# Patient Record
Sex: Female | Born: 1985 | Hispanic: Yes | Marital: Married | Smoking: Never smoker
Health system: Southern US, Community
[De-identification: ages and names within clinical notes are randomized; demographics above are authoritative.]

## PROBLEM LIST (undated history)

## (undated) DIAGNOSIS — D649 Anemia, unspecified: Secondary | ICD-10-CM

## (undated) DIAGNOSIS — K769 Liver disease, unspecified: Secondary | ICD-10-CM

## (undated) DIAGNOSIS — E119 Type 2 diabetes mellitus without complications: Secondary | ICD-10-CM

## (undated) HISTORY — DX: Type 2 diabetes mellitus without complications: E11.9

## (undated) HISTORY — DX: Liver disease, unspecified: K76.9

## (undated) HISTORY — DX: Anemia, unspecified: D64.9

---

## 2008-03-23 ENCOUNTER — Emergency Department: Payer: Self-pay | Admitting: Emergency Medicine

## 2008-11-05 ENCOUNTER — Ambulatory Visit: Payer: Self-pay | Admitting: Advanced Practice Midwife

## 2009-03-30 ENCOUNTER — Ambulatory Visit: Payer: Self-pay | Admitting: Family Medicine

## 2009-05-06 ENCOUNTER — Observation Stay: Payer: Self-pay | Admitting: Obstetrics and Gynecology

## 2009-12-01 IMAGING — US US OB < 14 WEEKS - US OB TV
1 series · 17 of 28 positions shown · non-contrast
Comparison: none

REASON FOR EXAM: pain and vaginal bleeding  [HOSPITAL]
COMMENTS:

[Series 1: us ob < 14 weeks - us ob tv · 17 of 44 slices shown]
[im 1/44]
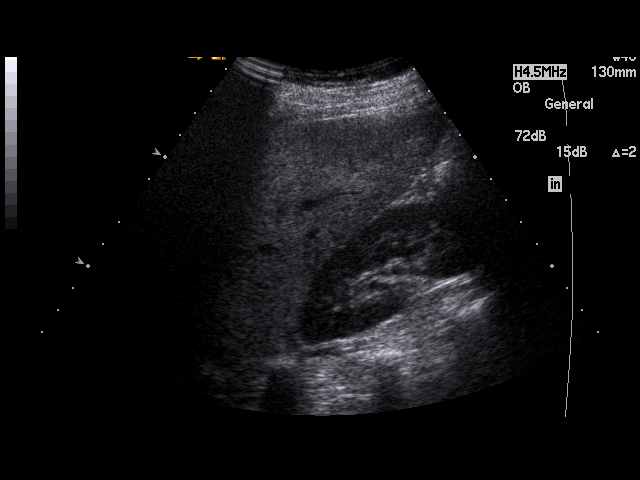
[im 4/44]
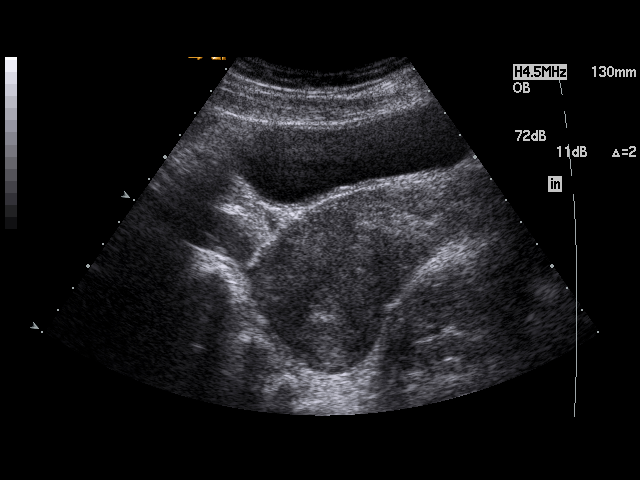
[im 7/44]
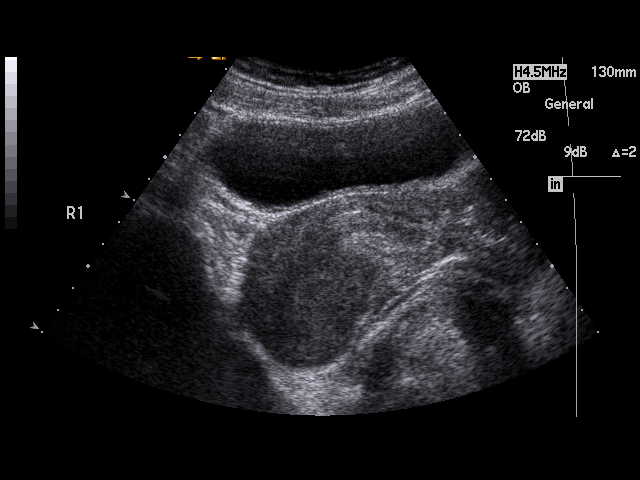
[im 8/44]
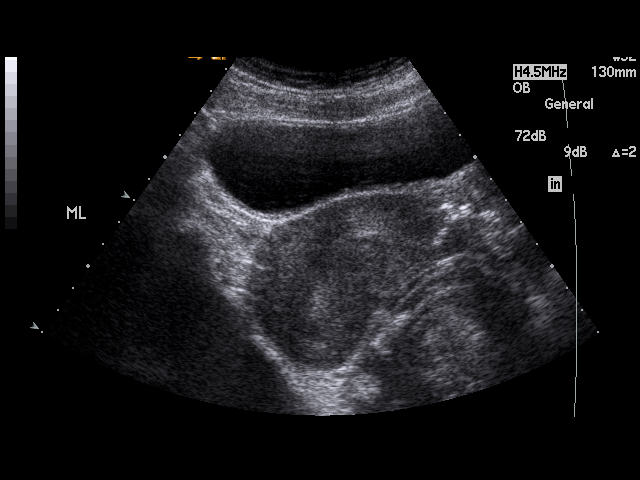
[im 12/44]
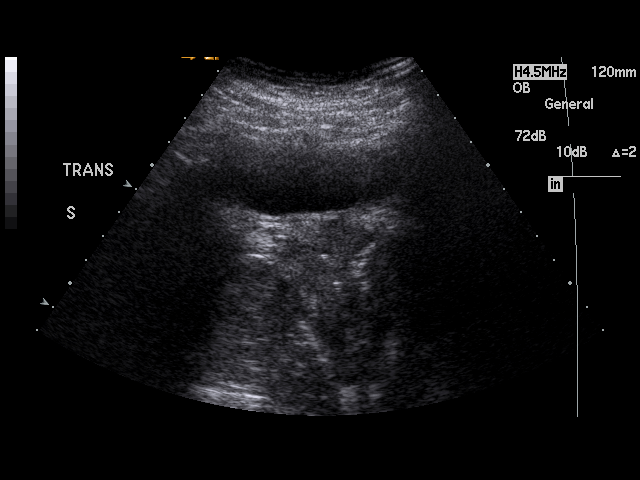
[im 15/44]
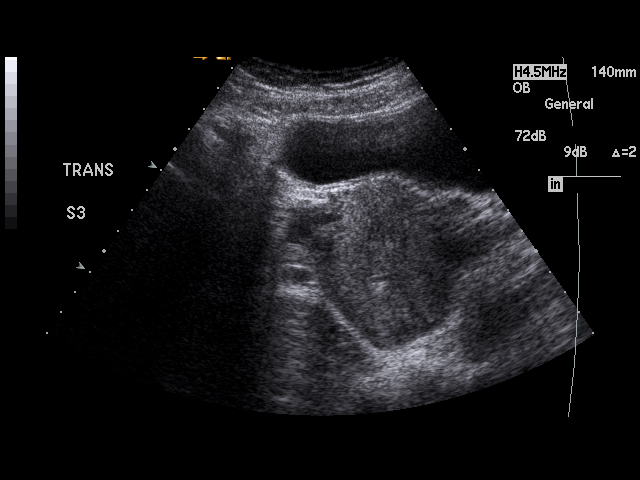
[im 16/44]
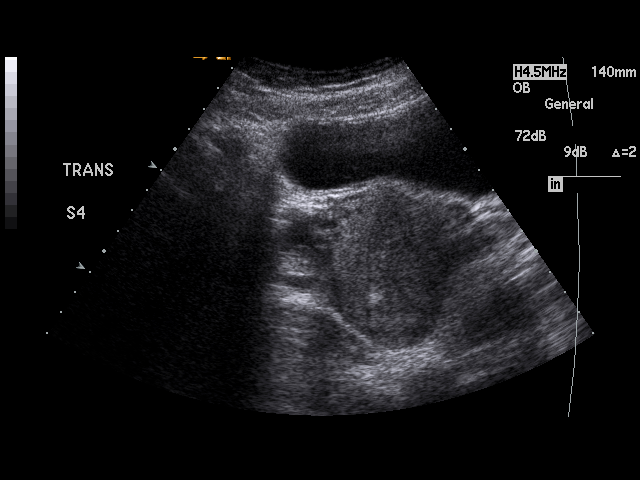
[im 20/44]
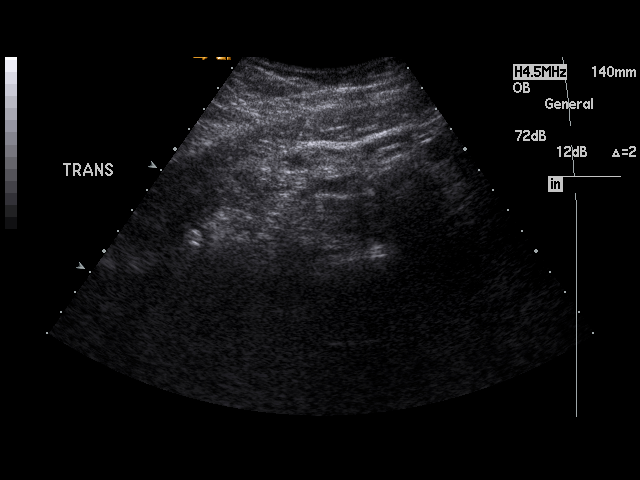
[im 23/44]
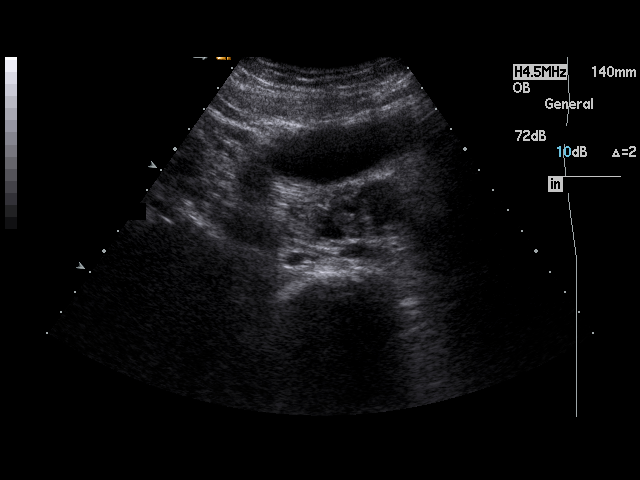
[im 24/44]
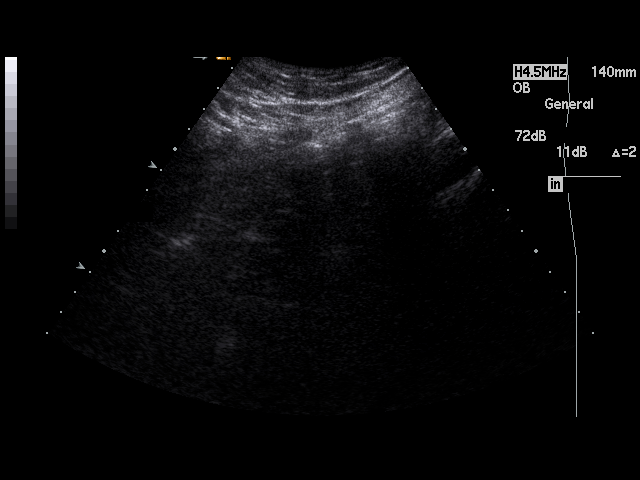
[im 28/44]
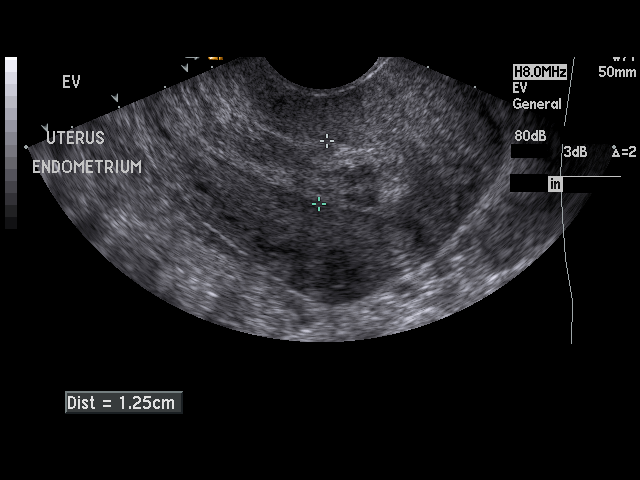
[im 29/44]
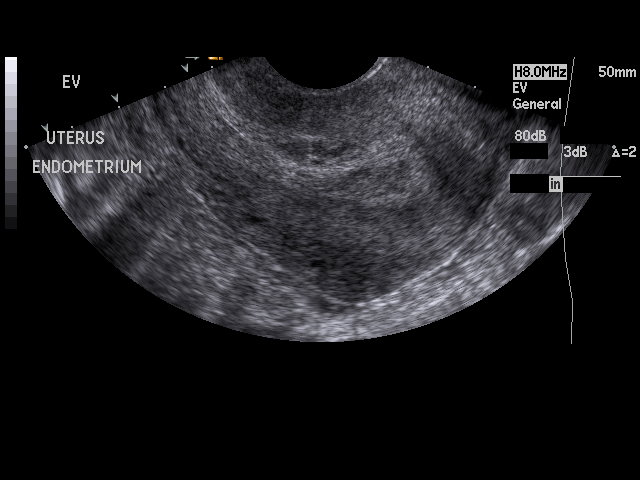
[im 32/44]
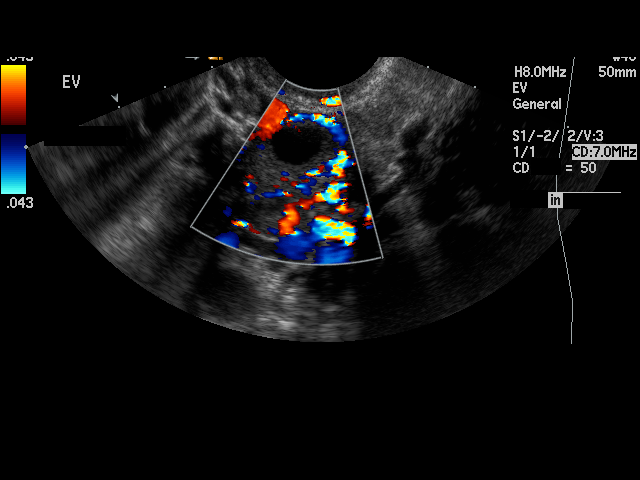
[im 36/44]
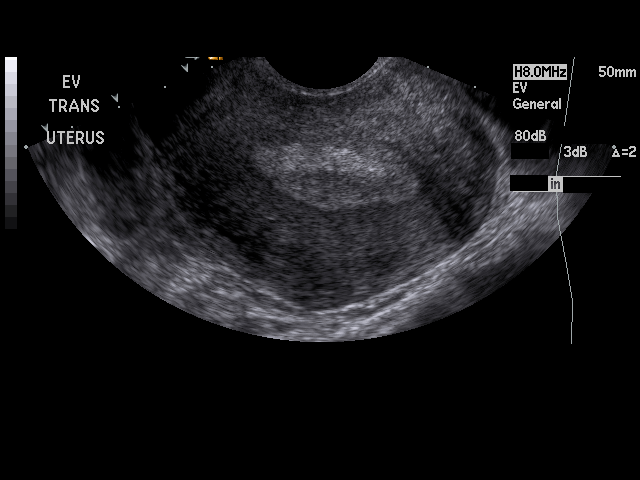
[im 37/44]
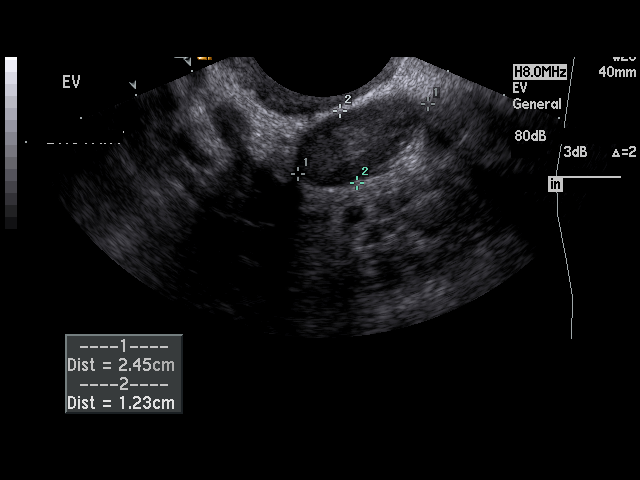
[im 40/44]
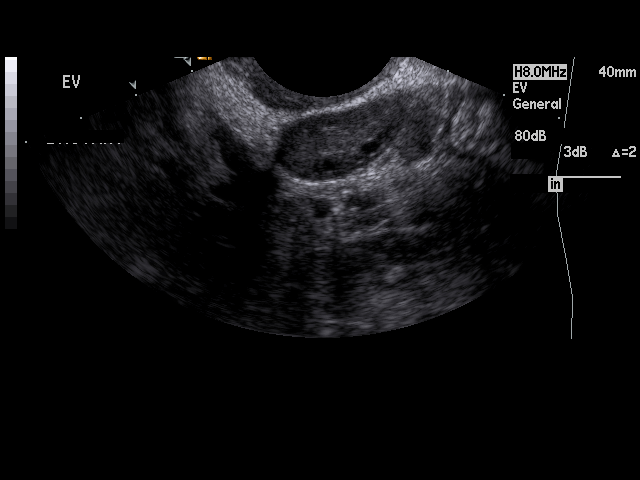
[im 44/44]
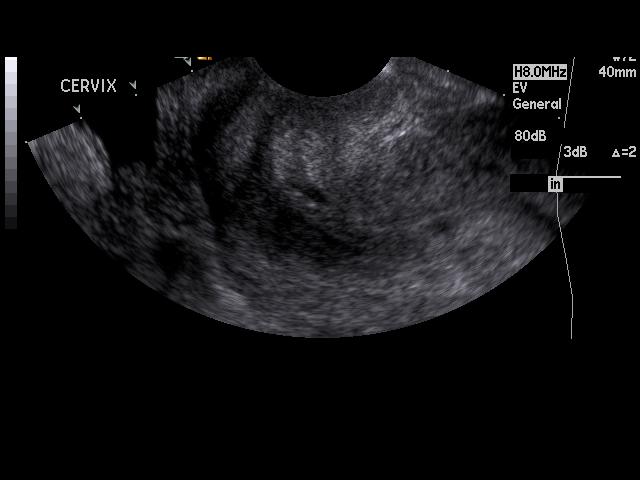

[17 of 28 positions shown; findings below may reference images not displayed]

PROCEDURE:     US  - US OB LESS THAN 14 WEEKS/W TRANS  - March 24, 2008  [DATE]

RESULT:     Emergent pelvic ultrasound utilizing transabdominal and
endovaginal imaging demonstrates no identifiable intrauterine gestational
sac. The kidneys appear to be grossly normal. The uterus appears to be
slightly retroverted. The endometrial stripe measures 12.5 mm in thickness.
There is some nonspecific fluid and echogenic debris within the endometrial
canal. The ovaries appear to be within normal limits. The RIGHT ovary
contains a roughly 10.7 mm cyst. There is no definite free fluid. The LEFT
ovary is normal.
IMPRESSION: No identifiable intrauterine or extrauterine gestation.
Followup with quantitative beta-hCG is recommended. A repeat examination
could be considered. There is thickening of the endometrium with some
irregular areas of echotexture which could represent clot or debris. A
recent abortion could give a similar appearance.

## 2010-12-07 IMAGING — US US OB US >=[ID] SNGL FETUS
1 series · 17 of 28 positions shown · non-contrast
Comparison: none

REASON FOR EXAM: Dates Size Greater Than Dates Needs Interpreter
COMMENTS:

[Series 1: us ob us >=(id) sngl fetus · 17 of 82 slices shown]
[im 1/82]
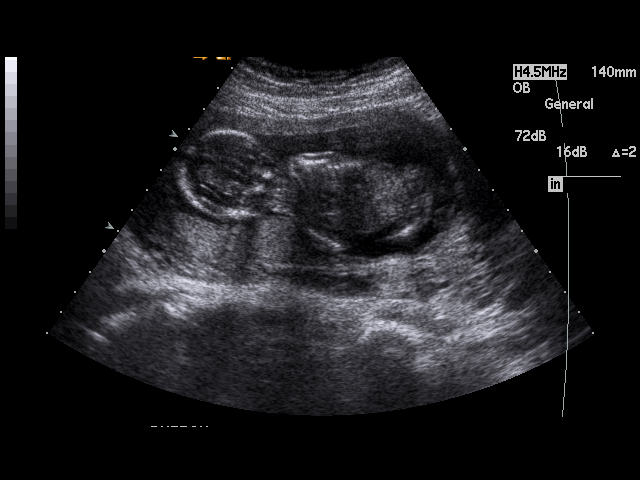
[im 7/82]
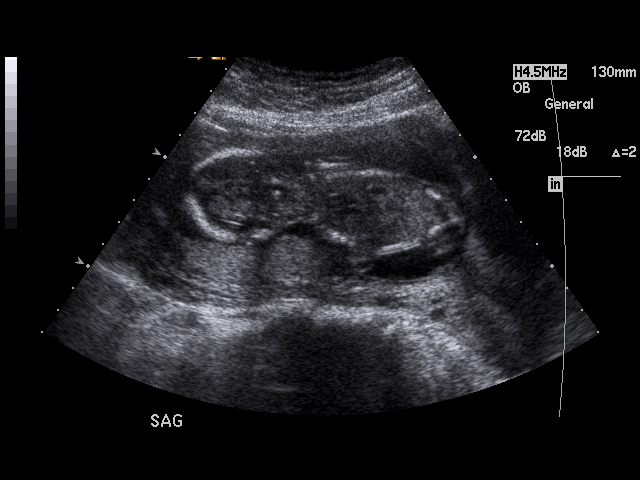
[im 13/82]
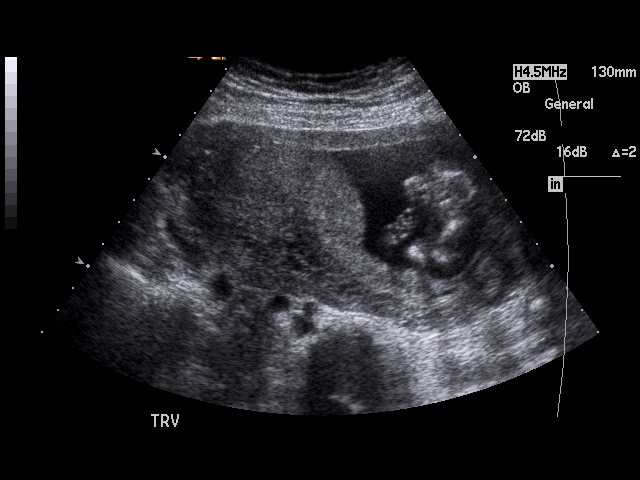
[im 16/82]
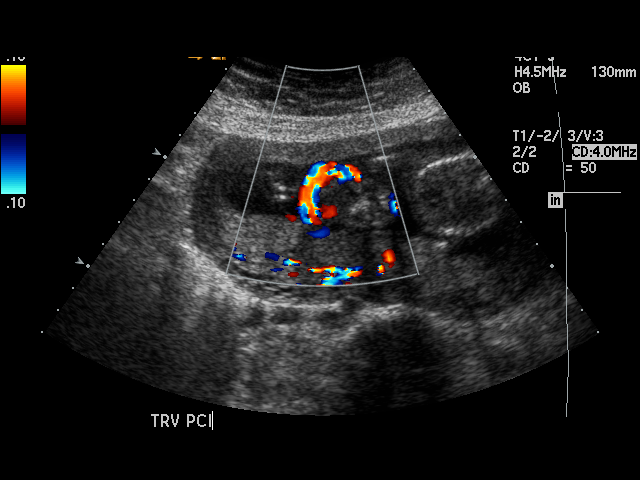
[im 22/82]
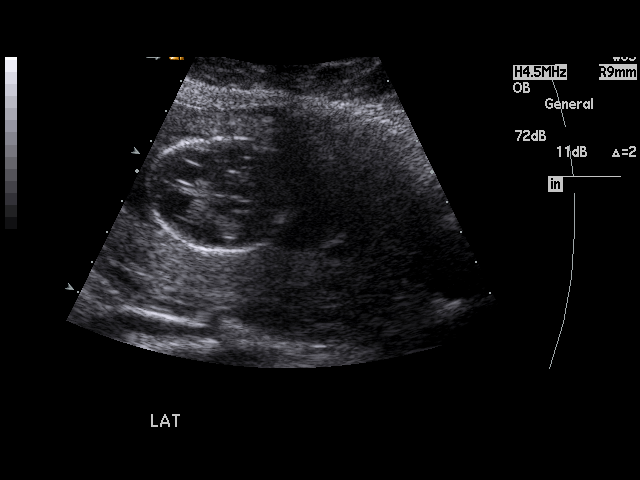
[im 28/82]
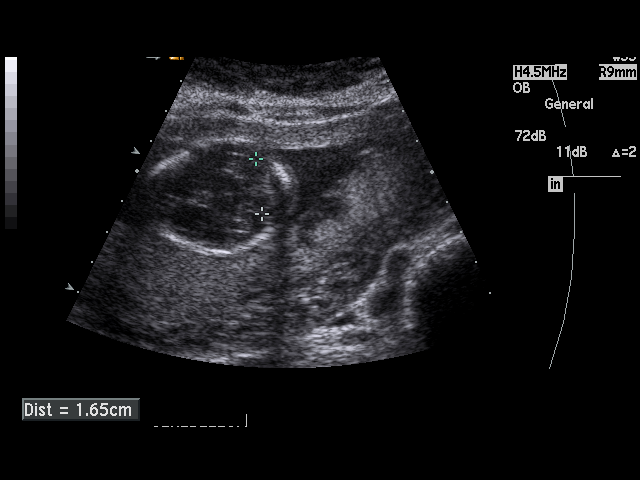
[im 31/82]
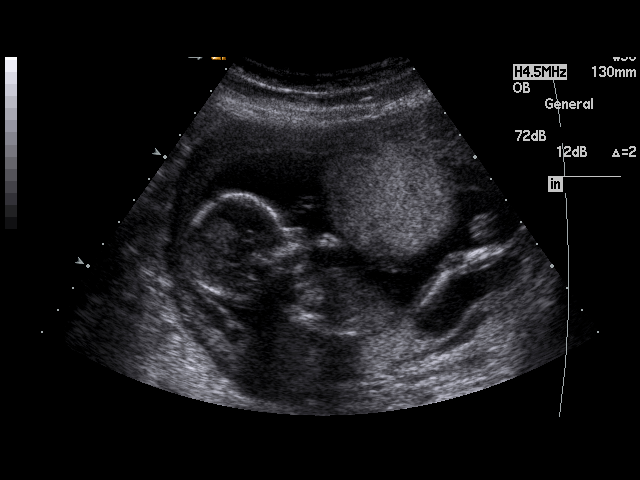
[im 37/82]
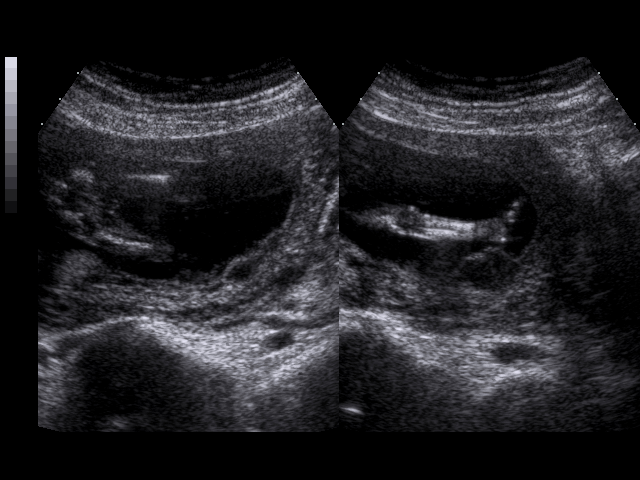
[im 43/82]
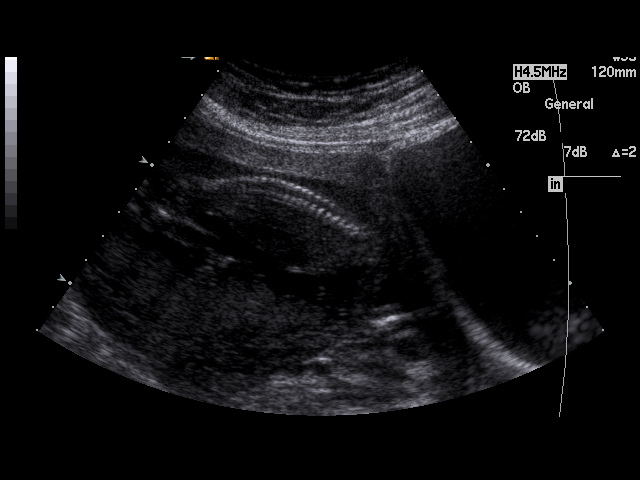
[im 46/82]
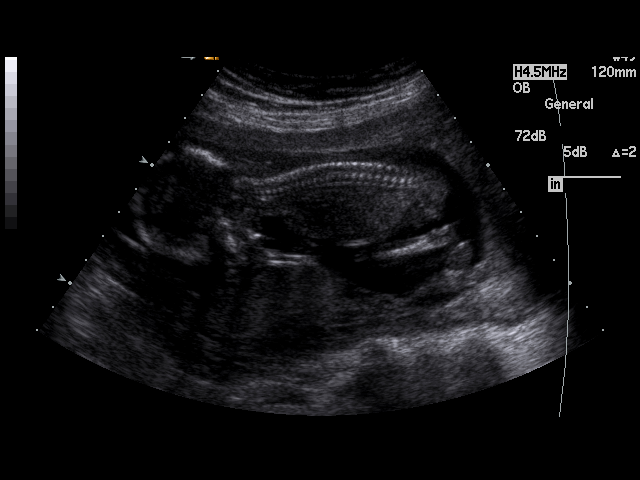
[im 52/82]
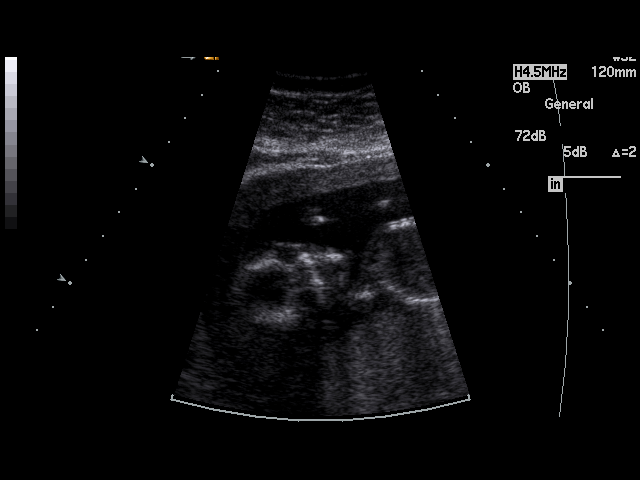
[im 55/82]
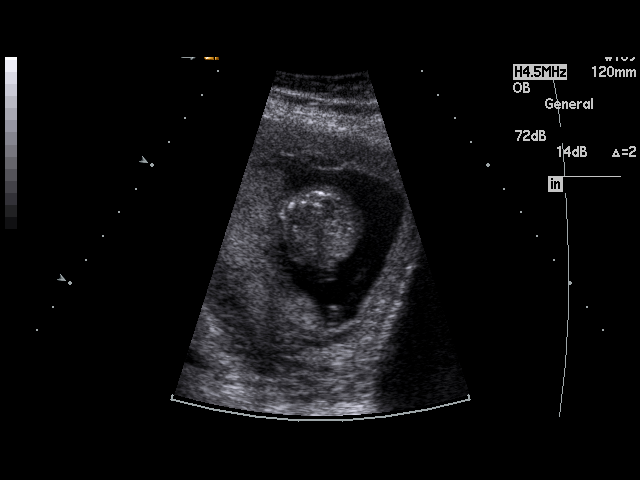
[im 61/82]
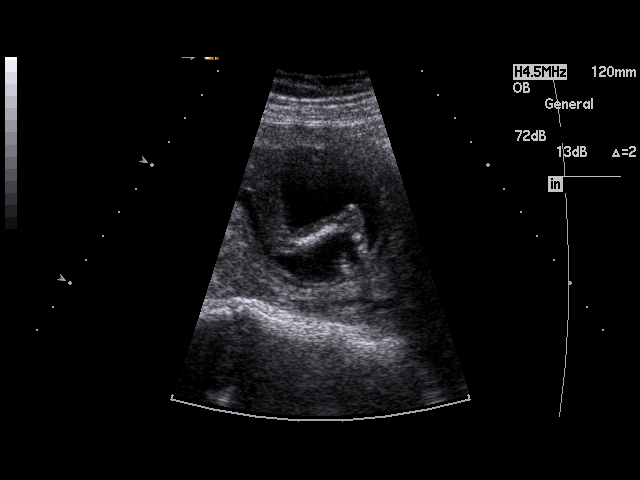
[im 67/82]
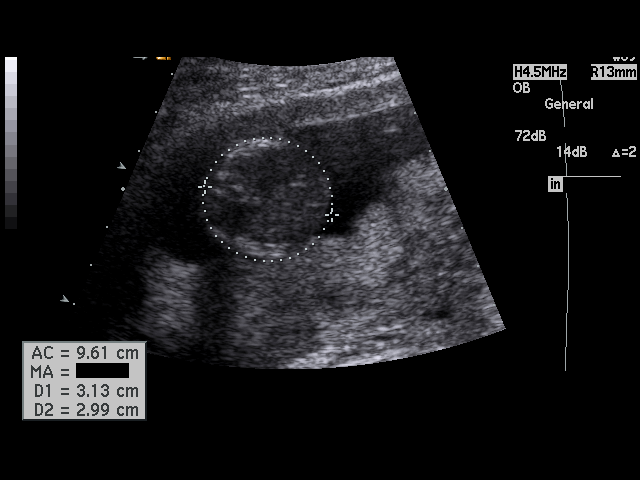
[im 70/82]
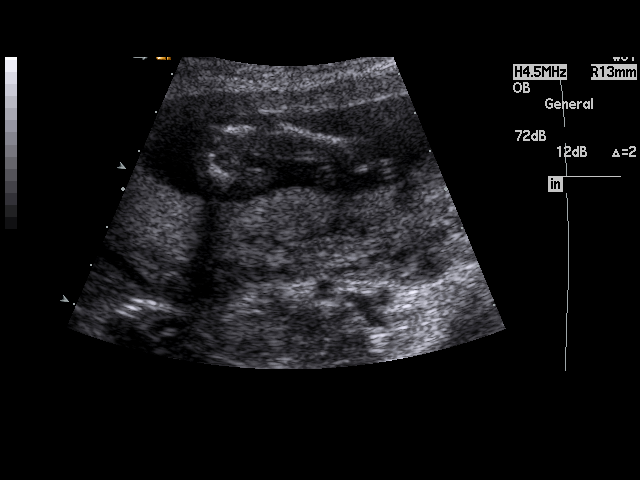
[im 76/82]
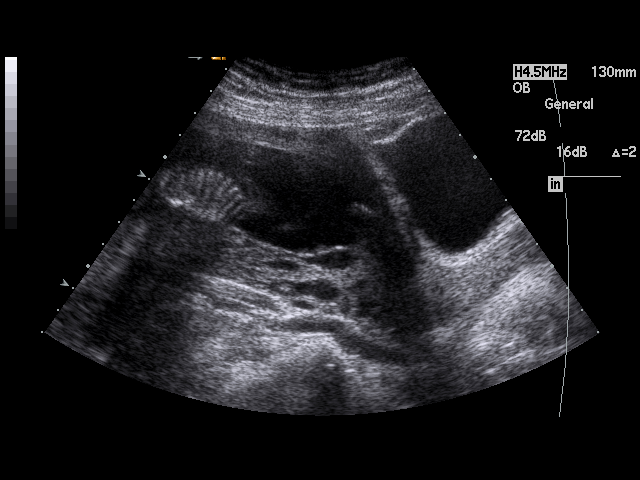
[im 82/82]
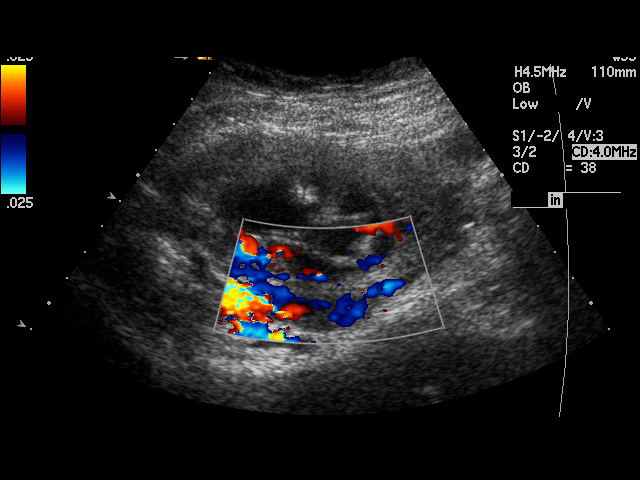

[17 of 28 positions shown; findings below may reference images not displayed]

PROCEDURE:     US  - US OB GREATER/OR EQUAL TO SS20R  - March 30, 2009  [DATE]

RESULT:     There is a gravid uterus. The presentation is currently breech.
The placenta is posterior and partially fundal. The amniotic fluid volume is
estimated to be normal. Cardiac activity with a rate of 144 beats per minute
was demonstrated. The intracranial structures, the craniocervical junction,
and the spinal structures are grossly normal for the early gestational age.
The fetal stomach, kidneys, and urinary bladder were demonstrated and
appeared normal.

Measured parameters:
BPD 3.38 cm corresponding to an EGA of 16 weeks 3 days
HC 12.47 cm corresponding to an EGA of 16 weeks 2 days
AC 10.0 cm corresponding to an EGA of 16 weeks 0 days
FL 1.92 cm corresponding to an EGA of 15 weeks 5 days
The estimated fetal weight is 161 grams + / - 22 grams.
IMPRESSION: There is an early IUP with estimated gestational age of 16
weeks one day plus or minus approximately 10 days. The estimated date of
confinement is 13 September, 2009. No fetal anomalies were identified. The
amniotic fluid volume appears normal.

## 2014-01-07 LAB — HM HIV SCREENING LAB: HM HIV Screening: NEGATIVE

## 2016-07-24 LAB — HM PAP SMEAR: HM Pap smear: NEGATIVE

## 2018-10-21 DIAGNOSIS — E119 Type 2 diabetes mellitus without complications: Secondary | ICD-10-CM | POA: Insufficient documentation

## 2019-12-18 ENCOUNTER — Emergency Department: Payer: Medicaid Other | Admitting: Certified Registered"

## 2019-12-18 ENCOUNTER — Observation Stay
Admission: EM | Admit: 2019-12-18 | Discharge: 2019-12-19 | Disposition: A | Discharge status: Home / Self Care | Payer: Medicaid Other | Attending: Obstetrics and Gynecology | Admitting: Obstetrics and Gynecology

## 2019-12-18 ENCOUNTER — Encounter
Admission: EM | Disposition: A | Discharge status: Home / Self Care | Payer: Self-pay | Source: Home / Self Care | Attending: Emergency Medicine

## 2019-12-18 ENCOUNTER — Other Ambulatory Visit: Payer: Self-pay

## 2019-12-18 DIAGNOSIS — O021 Missed abortion: Secondary | ICD-10-CM | POA: Diagnosis present

## 2019-12-18 DIAGNOSIS — Z20822 Contact with and (suspected) exposure to covid-19: Secondary | ICD-10-CM | POA: Insufficient documentation

## 2019-12-18 DIAGNOSIS — Z3A13 13 weeks gestation of pregnancy: Secondary | ICD-10-CM | POA: Insufficient documentation

## 2019-12-18 DIAGNOSIS — Z9889 Other specified postprocedural states: Secondary | ICD-10-CM

## 2019-12-18 DIAGNOSIS — R739 Hyperglycemia, unspecified: Secondary | ICD-10-CM | POA: Diagnosis not present

## 2019-12-18 DIAGNOSIS — O039 Complete or unspecified spontaneous abortion without complication: Secondary | ICD-10-CM

## 2019-12-18 DIAGNOSIS — O031 Delayed or excessive hemorrhage following incomplete spontaneous abortion: Principal | ICD-10-CM | POA: Insufficient documentation

## 2019-12-18 HISTORY — PX: DILATION AND EVACUATION: SHX1459

## 2019-12-18 LAB — BASIC METABOLIC PANEL
Anion gap: 12 (ref 5–15)
BUN: 13 mg/dL (ref 6–20)
CO2: 20 mmol/L — ABNORMAL LOW (ref 22–32)
Calcium: 9.1 mg/dL (ref 8.9–10.3)
Chloride: 103 mmol/L (ref 98–111)
Creatinine, Ser: 0.88 mg/dL (ref 0.44–1.00)
GFR calc Af Amer: >60 mL/min (ref >60)
GFR calc non Af Amer: >60 mL/min (ref >60)
Glucose, Bld: 186 mg/dL — ABNORMAL HIGH (ref 70–99)
Potassium: 3.2 mmol/L — ABNORMAL LOW (ref 3.5–5.1)
Sodium: 135 mmol/L (ref 135–145)

## 2019-12-18 LAB — CBC
HCT: 32.9 % — ABNORMAL LOW (ref 36.0–46.0)
Hemoglobin: 10.7 g/dL — ABNORMAL LOW (ref 12.0–15.0)
MCH: 25.6 pg — ABNORMAL LOW (ref 26.0–34.0)
MCHC: 32.5 g/dL (ref 30.0–36.0)
MCV: 78.7 fL — ABNORMAL LOW (ref 80.0–100.0)
Platelets: 362 10*3/uL (ref 150–400)
RBC: 4.18 MIL/uL (ref 3.87–5.11)
RDW: 13.2 % (ref 11.5–15.5)
WBC: 10.3 10*3/uL (ref 4.0–10.5)
nRBC: 0 % (ref 0.0–0.2)

## 2019-12-18 LAB — GLUCOSE, CAPILLARY: Glucose-Capillary: 202 mg/dL — ABNORMAL HIGH (ref 70–99)

## 2019-12-18 LAB — TYPE AND SCREEN
ABO/RH(D): O POS
Antibody Screen: NEGATIVE

## 2019-12-18 LAB — SARS CORONAVIRUS 2 BY RT PCR (HOSPITAL ORDER, PERFORMED IN ~~LOC~~ HOSPITAL LAB): SARS Coronavirus 2: NEGATIVE

## 2019-12-18 LAB — HCG, QUANTITATIVE, PREGNANCY: hCG, Beta Chain, Quant, S: 270 m[IU]/mL — ABNORMAL HIGH (ref <5)

## 2019-12-18 SURGERY — DILATION AND EVACUATION, UTERUS
Anesthesia: General

## 2019-12-18 MED ORDER — ONDANSETRON HCL 4 MG PO TABS: 4.0000 mg | ORAL_TABLET | Freq: Four times a day (QID) | ORAL | Status: DC | PRN

## 2019-12-18 MED ORDER — FENTANYL CITRATE (PF) 100 MCG/2ML IJ SOLN
INTRAMUSCULAR | Status: AC
  Filled 2019-12-18: qty 2

## 2019-12-18 MED ORDER — ACETAMINOPHEN 500 MG PO TABS
1000.0000 mg | ORAL_TABLET | Freq: Four times a day (QID) | ORAL | Status: DC
  Administered 2019-12-19: 1000 mg via ORAL
  Filled 2019-12-18 (×2): qty 2

## 2019-12-18 MED ORDER — SODIUM CHLORIDE 0.9 % IV SOLN
100.0000 mg | Freq: Once | INTRAVENOUS | Status: AC
  Administered 2019-12-18: 100 mg via INTRAVENOUS
  Filled 2019-12-18: qty 100

## 2019-12-18 MED ORDER — IBUPROFEN 600 MG PO TABS: 600.0000 mg | ORAL_TABLET | Freq: Four times a day (QID) | ORAL | Status: DC

## 2019-12-18 MED ORDER — TRAMADOL HCL 50 MG PO TABS: 50.0000 mg | ORAL_TABLET | Freq: Four times a day (QID) | ORAL | Status: DC | PRN

## 2019-12-18 MED ORDER — FENTANYL CITRATE (PF) 100 MCG/2ML IJ SOLN
INTRAMUSCULAR | Status: DC | PRN
  Administered 2019-12-18: 50 ug via INTRAVENOUS

## 2019-12-18 MED ORDER — SUCCINYLCHOLINE CHLORIDE 20 MG/ML IJ SOLN
INTRAMUSCULAR | Status: DC | PRN
  Administered 2019-12-18: 100 mg via INTRAVENOUS

## 2019-12-18 MED ORDER — SODIUM CHLORIDE FLUSH 0.9 % IV SOLN
INTRAVENOUS | Status: AC
  Filled 2019-12-18: qty 10

## 2019-12-18 MED ORDER — MIDAZOLAM HCL 2 MG/2ML IJ SOLN
INTRAMUSCULAR | Status: AC
  Filled 2019-12-18: qty 2

## 2019-12-18 MED ORDER — ONDANSETRON HCL 4 MG/2ML IJ SOLN
INTRAMUSCULAR | Status: DC | PRN
  Administered 2019-12-18: 4 mg via INTRAVENOUS

## 2019-12-18 MED ORDER — AMMONIA AROMATIC IN INHA
RESPIRATORY_TRACT | Status: AC
  Filled 2019-12-18: qty 20

## 2019-12-18 MED ORDER — PROPOFOL 10 MG/ML IV BOLUS
INTRAVENOUS | Status: DC | PRN
  Administered 2019-12-18: 130 mg via INTRAVENOUS

## 2019-12-18 MED ORDER — DOXYCYCLINE HYCLATE 100 MG IV SOLR: 200.0000 mg | INTRAVENOUS | Status: DC

## 2019-12-18 MED ORDER — MIDAZOLAM HCL 2 MG/2ML IJ SOLN
INTRAMUSCULAR | Status: DC | PRN
  Administered 2019-12-18: 2 mg via INTRAVENOUS

## 2019-12-18 MED ORDER — PROMETHAZINE HCL 25 MG/ML IJ SOLN: 6.2500 mg | INTRAMUSCULAR | Status: DC | PRN

## 2019-12-18 MED ORDER — MORPHINE SULFATE (PF) 2 MG/ML IV SOLN: 1.0000 mg | INTRAVENOUS | Status: DC | PRN

## 2019-12-18 MED ORDER — PROPOFOL 10 MG/ML IV BOLUS
INTRAVENOUS | Status: AC
  Filled 2019-12-18: qty 20

## 2019-12-18 MED ORDER — DOCUSATE SODIUM 100 MG PO CAPS
100.0000 mg | ORAL_CAPSULE | Freq: Two times a day (BID) | ORAL | Status: DC
  Administered 2019-12-19: 100 mg via ORAL
  Filled 2019-12-18: qty 1

## 2019-12-18 MED ORDER — SIMETHICONE 80 MG PO CHEW
80.0000 mg | CHEWABLE_TABLET | Freq: Four times a day (QID) | ORAL | Status: DC
  Administered 2019-12-19 (×2): 80 mg via ORAL
  Filled 2019-12-18 (×2): qty 1

## 2019-12-18 MED ORDER — ONDANSETRON HCL 4 MG/2ML IJ SOLN: 4.0000 mg | Freq: Four times a day (QID) | INTRAMUSCULAR | Status: DC | PRN

## 2019-12-18 MED ORDER — SILVER NITRATE-POT NITRATE 75-25 % EX MISC
CUTANEOUS | Status: AC
  Filled 2019-12-18: qty 40

## 2019-12-18 MED ORDER — DEXAMETHASONE SODIUM PHOSPHATE 10 MG/ML IJ SOLN
INTRAMUSCULAR | Status: DC | PRN
  Administered 2019-12-18: 10 mg via INTRAVENOUS

## 2019-12-18 MED ORDER — SODIUM CHLORIDE 0.9 % IV SOLN
INTRAVENOUS | Status: AC
  Administered 2019-12-18: 2000 mL via INTRAVENOUS

## 2019-12-18 MED ORDER — LIDOCAINE HCL (CARDIAC) PF 100 MG/5ML IV SOSY
PREFILLED_SYRINGE | INTRAVENOUS | Status: DC | PRN
  Administered 2019-12-18: 80 mg via INTRAVENOUS

## 2019-12-18 MED ORDER — PROMETHAZINE HCL 25 MG/ML IJ SOLN
INTRAMUSCULAR | Status: AC
  Filled 2019-12-18: qty 1

## 2019-12-18 MED ORDER — LACTATED RINGERS IV SOLN: INTRAVENOUS | Status: DC

## 2019-12-18 MED ORDER — SODIUM CHLORIDE 0.9 % IV SOLN
INTRAVENOUS | Status: DC | PRN
Start: 2019-12-18 — End: 2019-12-18

## 2019-12-18 MED ORDER — FENTANYL CITRATE (PF) 100 MCG/2ML IJ SOLN: 25.0000 ug | INTRAMUSCULAR | Status: DC | PRN

## 2019-12-18 MED ORDER — TRAMADOL HCL 50 MG PO TABS: 50.0000 mg | ORAL_TABLET | Freq: Four times a day (QID) | ORAL | 0 refills | Status: DC | PRN

## 2019-12-18 SURGICAL SUPPLY — 23 items
BAG COUNTER SPONGE EZ (MISCELLANEOUS) ×2 IMPLANT
CATH ROBINSON RED A/P 16FR (CATHETERS) ×3 IMPLANT
COUNTER SPONGE BAG EZ (MISCELLANEOUS) ×1
FILTER UTR ASPR SPEC (MISCELLANEOUS) ×1 IMPLANT
FLTR UTR ASPR SPEC (MISCELLANEOUS) ×3
GAUZE 4X4 16PLY RFD (DISPOSABLE) ×3 IMPLANT
GLOVE BIOGEL PI IND STRL 6.5 (GLOVE) ×2 IMPLANT
GLOVE BIOGEL PI INDICATOR 6.5 (GLOVE) ×4
GLOVE SURG SYN 6.5 ES PF (GLOVE) ×6 IMPLANT
GOWN STRL REUS W/ TWL LRG LVL3 (GOWN DISPOSABLE) ×2 IMPLANT
GOWN STRL REUS W/TWL LRG LVL3 (GOWN DISPOSABLE) ×4
KIT BERKELEY 1ST TRIMESTER 3/8 (MISCELLANEOUS) ×3 IMPLANT
KIT TURNOVER CYSTO (KITS) ×3 IMPLANT
NS IRRIG 500ML POUR BTL (IV SOLUTION) ×3 IMPLANT
PACK DNC HYST (MISCELLANEOUS) ×3 IMPLANT
PAD OB MATERNITY 4.3X12.25 (PERSONAL CARE ITEMS) ×3 IMPLANT
PAD PREP 24X41 OB/GYN DISP (PERSONAL CARE ITEMS) ×3 IMPLANT
SET BERKELEY SUCTION TUBING (SUCTIONS) ×3 IMPLANT
TOWEL OR 17X26 4PK STRL BLUE (TOWEL DISPOSABLE) ×3 IMPLANT
VACURETTE 10 RIGID CVD (CANNULA) IMPLANT
VACURETTE 12 RIGID CVD (CANNULA) IMPLANT
VACURETTE 8 RIGID CVD (CANNULA) IMPLANT
VACURETTE 8MM F TIP (MISCELLANEOUS) ×6 IMPLANT

## 2019-12-18 NOTE — ED Provider Notes (Signed)
ER Provider Note       Time seen: 6:41 PM    I have reviewed the vital signs and the nursing notes.  HISTORY   Chief Complaint Vaginal Bleeding    HPI Judy Clark is a 34 y.o. female with no known past medical history but who is reportedly in her fifth pregnancy and having vaginal bleeding who presents today for vaginal bleeding.  Reportedly she was at [facility] yesterday and she was given misoprostol all.  She is started having heavy vaginal bleeding and passing of tissue and clots.  She felt like she was going to pass out.  Discomfort was 10 out of 10.  History reviewed. No pertinent past medical history.  History reviewed. No pertinent surgical history.  Allergies Patient has no allergy information on record.  Review of Systems Constitutional: Negative for fever. Cardiovascular: Negative for chest pain. Respiratory: Negative for shortness of breath. Gastrointestinal: Positive for abdominal pain Genitourinary: Positive for heavy vaginal bleeding and cramping Musculoskeletal: Negative for back pain. Skin: Negative for rash. Neurological: Negative for headaches, focal weakness or numbness.  All systems negative/normal/unremarkable except as stated in the HPI  ____________________________________________   PHYSICAL EXAM:  VITAL SIGNS: Vitals:   12/18/19 1806  BP: 93/81  Pulse: (!) 150  Resp: 18  Temp: 98.5 F (36.9 C)    Constitutional: Alert and oriented.  Mild to moderate distress Eyes: Conjunctivae are normal. Normal extraocular movements. Cardiovascular: Normal rate, regular rhythm. No murmurs, rubs, or gallops. Respiratory: Normal respiratory effort without tachypnea nor retractions. Breath sounds are clear and equal bilaterally. No wheezes/rales/rhonchi. Gastrointestinal: Lower abdominal tenderness Genitourinary: Heavy vaginal bleeding and clots are present, there is products of conception coming from the cervical os, fetus is  visualized Musculoskeletal: Nontender with normal range of motion in extremities. No lower extremity tenderness nor edema. Neurologic:  Normal speech and language. No gross focal neurologic deficits are appreciated.  Skin:  Skin is warm, dry and intact.  Pallor is noted Psychiatric:anxious mood and affect ____________________________________________   LABS (pertinent positives/negatives)  Labs Reviewed  CBC - Abnormal; Notable for the following components:      Result Value   Hemoglobin 10.7 (*)    HCT 32.9 (*)    MCV 78.7 (*)    MCH 25.6 (*)    All other components within normal limits  BASIC METABOLIC PANEL - Abnormal; Notable for the following components:   Potassium 3.2 (*)    CO2 20 (*)    Glucose, Bld 186 (*)    All other components within normal limits  GLUCOSE, CAPILLARY - Abnormal; Notable for the following components:   Glucose-Capillary 202 (*)    All other components within normal limits  HCG, QUANTITATIVE, PREGNANCY  POC URINE PREG, ED  TYPE AND SCREEN   Hemoglobin yesterday was 13.2, currently 10.7  DIFFERENTIAL DIAGNOSIS  Miscarriage, threatened miscarriage, hemorrhage, acute blood loss anemia  ASSESSMENT AND PLAN  Miscarriage, syncope   Plan: The patient had presented for heavy vaginal bleeding after misoprostol. Patient's labs did reveal drop in her hemoglobin from 13-10.7.  I discussed with OB/GYN who will take her to the OR for D&C.  She remained stable at this time.  Products of conception were placed in a specimen container.  Daryel November MD    Note: This note was generated in part or whole with voice recognition software. Voice recognition is usually quite accurate but there are transcription errors that can and very often do occur. I apologize for any typographical  errors that were not detected and corrected.     Emily Filbert, MD 12/18/19 [phone]

## 2019-12-18 NOTE — ED Notes (Signed)
Dr. Jerene Pitch at bedside speaking with pt, pt verbalizes understanding of D&C procedure

## 2019-12-18 NOTE — Anesthesia Procedure Notes (Signed)
Procedure Name: Intubation Date/Time: 12/18/2019 8:23 PM Performed by: Martha Clan, MD Pre-anesthesia Checklist: Patient identified, Emergency Drugs available, Suction available and Patient being monitored Patient Re-evaluated:Patient Re-evaluated prior to induction Oxygen Delivery Method: Circle system utilized Preoxygenation: Pre-oxygenation with 100% oxygen Induction Type: IV induction, Rapid sequence and Cricoid Pressure applied Laryngoscope Size: Mac and 3 Grade View: Grade I Tube type: Oral Tube size: 7.0 mm Number of attempts: 1 Airway Equipment and Method: Stylet Placement Confirmation: ETT inserted through vocal cords under direct vision,  positive ETCO2 and breath sounds checked- equal and bilateral Secured at: 19 cm Tube secured with: Tape Dental Injury: Teeth and Oropharynx as per pre-operative assessment

## 2019-12-18 NOTE — Progress Notes (Signed)
Patient reports she continues to be dizzy and fatigue. Will admit to observation overnight. Vital signs stable. H&H pending.   Adelene Idler MD, [facility], [facility] 12/18/2019 10:37 PM

## 2019-12-18 NOTE — Op Note (Signed)
  Operative Note  12/18/2019 9:02 PM  PRE-OP DIAGNOSIS: incomplete abortion   POST-OP DIAGNOSIS: same  SURGEON: Adelene Idler MD  ANESTHESIA: General   PROCEDURE: Procedure(s): DILATATION AND EVACUATION   ESTIMATED BLOOD LOSS: 200 mL   SPECIMENS: POC   COMPLICATIONS: none  DISPOSITION: PACU - hemodynamically stable.  CONDITION: stable  FINDINGS: Exam under anesthesia revealed a 13 wk size uterus without palpable adnexal masses.   INDICATION FOR PROCEDURE: Acute hemorrhage, retained products of conception  PROCEDURE IN DETAIL: After informed consent was obtained, the patient was taken to the operating room where anesthesia was obtained without difficulty. The patient was positioned in the dorsal lithotomy position with [employer]. Time out was performed and an exam under anesthesia was performed. The vagina, perineum, and lower abdomen were prepped and draped in a normal sterile fashion. The bladder was emptied with an I&O catheter. A speculum was placed into the vagina and the cervix was grasped with a single toothed tenaculum.  The cervix was visually dilated. A large amount of products of conception were teased from the cervical os with the ring forceps. The suction was then tested and found to be adequate, and a 8 mm flexible suction cannula was advanced into the uterine cavity. The suction was activated and the contents of the uterus were aspirated until no further tissue was obtained. The uterus was then curetted to gritty texture throughout.  At the end of the procedure bleeding was noted to beMinimal.  All instruments were then removed from the vagina.The patient tolerated the procedure well. All sponge, instrument, and needle counts were correct. The patient was taken to the recovery room in good condition.   Adelene Idler MD [facility], [facility] 12/18/2019 9:02 PM

## 2019-12-18 NOTE — Transfer of Care (Signed)
Immediate Anesthesia Transfer of Care Note  Patient: Binta Mojica Alvarado  Procedure(s) Performed: DILATATION AND EVACUATION (N/A )  Patient Location: PACU  Anesthesia Type:General  Level of Consciousness: awake, alert  and oriented  Airway & Oxygen Therapy: Patient Spontanous Breathing and Patient connected to face mask oxygen  Post-op Assessment: Report given to RN and Post -op Vital signs reviewed and stable  Post vital signs: Reviewed and stable  Last Vitals:  Vitals Value Taken Time  BP 107/69 12/18/19 2101  Temp    Pulse 94 12/18/19 2106  Resp 18 12/18/19 2106  SpO2 100 % 12/18/19 2106  Vitals shown include unvalidated device data.  Last Pain:  Vitals:   12/18/19 1927  TempSrc: Oral  PainSc: 6          Complications: No complications documented.

## 2019-12-18 NOTE — ED Notes (Signed)
Pt had syncopal episode in triage room. Pt appears pale. Pt now awake and wanting water to drink.

## 2019-12-18 NOTE — H&P (Signed)
Judy Clark is an 34 y.o. female.   Chief Complaint: Acute blood losst HPI: Patient presents to ER with significant hemorrhage.  Per the ER physician the patient passed a large amount of blood in triage, became syncopal, and had a trail of blood from triage desk to the room.  She was diagnosed with a missed abortion and took misoprostol at home earlier today.  Patient and her husband reports that about 4 hours ago she started passing large clots and having heavy bleeding.  She is feeling dizzy and lightheaded.  Her vital signs are currently stable.  The ER physician did a speculum exam and remove products of conception and a small fetus although he reports that the blood and clots kept coming after the removal of the products of conception.  POC at bedside in container are consistent with this report.   History reviewed. No pertinent past medical history.  History reviewed. No pertinent surgical history.  No family history on file. Social History:  has no history on file for tobacco use, alcohol use, and drug use.  Allergies: Not on File  (Not in a hospital admission)   Results for orders placed or performed during the hospital encounter of 12/18/19 (from the past 48 hour(s))  CBC     Status: Abnormal   Collection Time: 12/18/19  6:13 PM  Result Value Ref Range   WBC 10.3 4.0 - 10.5 K/uL   RBC 4.18 3.87 - 5.11 MIL/uL   Hemoglobin 10.7 (L) 12.0 - 15.0 g/dL   HCT 17.6 (L) 36 - 46 %   MCV 78.7 (L) 80.0 - 100.0 fL   MCH 25.6 (L) 26.0 - 34.0 pg   MCHC 32.5 30.0 - 36.0 g/dL   RDW 16.0 73.7 - 10.6 %   Platelets 362 150 - 400 K/uL   nRBC 0.0 0.0 - 0.2 %    Comment: Performed at [facility], [address] [place], [place] [address]  hCG, quantitative, pregnancy     Status: Abnormal   Collection Time: 12/18/19  6:13 PM  Result Value Ref Range   hCG, Beta Chain, Quant, S 270 (H) <5 mIU/mL    Comment:          GEST. AGE      CONC.  (mIU/mL)   <=1 WEEK        5 -  50     2 WEEKS       50 - 500     3 WEEKS       100 - 10,000     4 WEEKS     1,000 - 30,000     5 WEEKS     3,500 - 115,000   6-8 WEEKS     12,000 - 270,000    12 WEEKS     15,000 - 220,000        FEMALE AND NON-PREGNANT FEMALE:     LESS THAN 5 mIU/mL Performed at [facility], [address] [place], [place] [address]   Basic metabolic panel     Status: Abnormal   Collection Time: 12/18/19  6:13 PM  Result Value Ref Range   Sodium 135 135 - 145 mmol/L   Potassium 3.2 (L) 3.5 - 5.1 mmol/L   Chloride 103 98 - 111 mmol/L   CO2 20 (L) 22 - 32 mmol/L   Glucose, Bld 186 (H) 70 - 99 mg/dL    Comment: Glucose reference range applies only to samples taken after fasting  for at least 8 hours.   BUN 13 6 - 20 mg/dL   Creatinine, Ser 3.61 0.44 - 1.00 mg/dL   Calcium 9.1 8.9 - 44.3 mg/dL   GFR calc non Af Amer >60 >60 mL/min   GFR calc Af Amer >60 >60 mL/min   Anion gap 12 5 - 15    Comment: Performed at [facility], [address] [place], [place] [address]  Type and screen [facility]     Status: None (Preliminary result)   Collection Time: 12/18/19  6:13 PM  Result Value Ref Range   ABO/RH(D) PENDING    Antibody Screen PENDING    Sample Expiration      12/21/2019,2359 Performed at [facility], [address] [place], [place] [address]   Glucose, capillary     Status: Abnormal   Collection Time: 12/18/19  6:22 PM  Result Value Ref Range   Glucose-Capillary 202 (H) 70 - 99 mg/dL    Comment: Glucose reference range applies only to samples taken after fasting for at least 8 hours.  Type and screen Ordered by PROVIDER DEFAULT     Status: None (Preliminary result)   Collection Time: 12/18/19  7:02 PM  Result Value Ref Range   ABO/RH(D) PENDING    Antibody Screen PENDING    Sample Expiration      12/21/2019,2359 Performed at [facility], [address] [place], [place] [address]    No results found.  Review of  Systems  Constitutional: Negative for chills and fever.  HENT: Negative for congestion, hearing loss and sinus pain.   Respiratory: Negative for cough, shortness of breath and wheezing.   Cardiovascular: Negative for chest pain, palpitations and leg swelling.  Gastrointestinal: Negative for abdominal pain, constipation, diarrhea, nausea and vomiting.  Genitourinary: Negative for dysuria, flank pain, frequency, hematuria and urgency.  Musculoskeletal: Negative for back pain.  Skin: Negative for rash.  Neurological: Negative for dizziness and headaches.  Psychiatric/Behavioral: Negative for suicidal ideas. The patient is not nervous/anxious.     Blood pressure 114/82, pulse 94, temperature 98.9 F (37.2 C), temperature source Oral, resp. rate 18, height 5\' 2"  (1.575 m), weight 65.8 kg, SpO2 100 %. Physical Exam Vitals and nursing note reviewed.  Constitutional:      Appearance: She is well-developed.  HENT:     Head: Normocephalic and atraumatic.  Cardiovascular:     Rate and Rhythm: Normal rate and regular rhythm.  Pulmonary:     Effort: Pulmonary effort is normal.     Breath sounds: Normal breath sounds.  Abdominal:     General: Bowel sounds are normal.     Palpations: Abdomen is soft.  Genitourinary:    Comments: Bright red blood on perineum.  Active bleeding. Musculoskeletal:        General: Normal range of motion.  Skin:    General: Skin is warm and dry.  Neurological:     Mental Status: She is alert and oriented to person, place, and time.  Psychiatric:        Behavior: Behavior normal.        Thought Content: Thought content normal.        Judgment: Judgment normal.     Bedside abdominal ultrasound showed retained products of conception.  Color flow positive in the upper aspect of the endometrium.  Endometrial thickness measures 17 mm.  Assessment/Plan 34yo with incomplete miscarriage, retained products of conceptions and acute hemorrhage Will take the patient to  the  OR for emergent Dilation and Evacuation Discussed with the patient and her husband her options regarding expectant management versus dilation and evacuation for the retained products of conception.  Given her significant hemorrhage and active bleeding recommended dilation and evacuation for removal of retained products of conception. Patient is being fluid resuscitated with IV fluid boluses.  Vital signs are stable currently.  Hemoglobin was 10.7.  Will need to recheck CBC after the procedure and patient may be observed overnight for her acute blood loss.   Discussed risks of dilation evacuation including risk of infection risk of damage to surrounding pelvic organs and tissues and risk of hemorrhage.  Patient signed consent forms and agreed to proceed with the procedure.  Spanish interpreter was present and translated this conversation for the patient and myself.   Natale Milch, MD 12/18/2019, 7:37 PM

## 2019-12-18 NOTE — OR Nursing (Signed)
Patient still feels very weak and dizzy.  Dr. Jerene Pitch informed via text.

## 2019-12-18 NOTE — ED Notes (Signed)
Pt left to OR accompanied by husband

## 2019-12-18 NOTE — Anesthesia Preprocedure Evaluation (Signed)
Anesthesia Evaluation  Patient identified by MRN, date of birth, ID band Patient awake    Reviewed: Allergy & Precautions, H&P , NPO status , Patient's Chart, lab work & pertinent test results, reviewed documented beta blocker date and time   History of Anesthesia Complications Negative for: history of anesthetic complications  Airway Mallampati: III  TM Distance: >3 FB Neck ROM: full    Dental  (+) Dental Advidsory Given, Teeth Intact   Pulmonary neg pulmonary ROS,    Pulmonary exam normal breath sounds clear to auscultation       Cardiovascular Exercise Tolerance: Good negative cardio ROS Normal cardiovascular exam Rhythm:regular Rate:Normal     Neuro/Psych negative neurological ROS  negative psych ROS   GI/Hepatic Neg liver ROS, GERD  ,  Endo/Other  negative endocrine ROS  Renal/GU negative Renal ROS  negative genitourinary   Musculoskeletal   Abdominal   Peds  Hematology negative hematology ROS (+)   Anesthesia Other Findings History reviewed. No pertinent past medical history.   Reproductive/Obstetrics (+) Pregnancy Missed AB                             Anesthesia Physical Anesthesia Plan  ASA: II and emergent  Anesthesia Plan: General   Post-op Pain Management:    Induction: Intravenous, Rapid sequence and Cricoid pressure planned  PONV Risk Score and Plan: 3 and Ondansetron, Dexamethasone, Midazolam and Treatment may vary due to age or medical condition  Airway Management Planned: Oral ETT  Additional Equipment:   Intra-op Plan:   Post-operative Plan: Extubation in OR  Informed Consent: I have reviewed the patients History and Physical, chart, labs and discussed the procedure including the risks, benefits and alternatives for the proposed anesthesia with the patient or authorized representative who has indicated his/her understanding and acceptance.     Dental  Advisory Given  Plan Discussed with: Anesthesiologist, CRNA and Surgeon  Anesthesia Plan Comments:         Anesthesia Quick Evaluation

## 2019-12-18 NOTE — OR Nursing (Signed)
Patient going to room 348 via bed.  Husband at bedside.

## 2019-12-18 NOTE — ED Notes (Signed)
Pt had second syncopal episode in triage and was snoring before waking back up and responding to this RN

## 2019-12-18 NOTE — ED Notes (Signed)
Dr. Jerene Pitch at bedside with [place] machine

## 2019-12-18 NOTE — ED Notes (Signed)
Pt reports no surgeries in the past, no allergies, 3 living children at home.

## 2019-12-18 NOTE — ED Notes (Signed)
Fetus and clot placed in container for surgical pathology. Specimen at bedside at this time with patient label

## 2019-12-18 NOTE — ED Notes (Signed)
OBGYN MD schuman at bedside

## 2019-12-18 NOTE — ED Triage Notes (Addendum)
Pt comes with c/o vaginal bleeding. Pt states severe pain. Pt unable to sit still in chair. Pt states she was pregnant and at [facility] yesterday and they stated no heartbeat from the baby. [facility] gave her medication to clean her out. Pt given Misoprostol.  Pt states she feels like she is going to pass out. Pt states no hearing from ear

## 2019-12-19 ENCOUNTER — Encounter: Payer: Self-pay | Admitting: Obstetrics and Gynecology

## 2019-12-19 LAB — CBC
HCT: 25.6 % — ABNORMAL LOW (ref 36.0–46.0)
Hemoglobin: 7.9 g/dL — ABNORMAL LOW (ref 12.0–15.0)
MCH: 25.3 pg — ABNORMAL LOW (ref 26.0–34.0)
MCHC: 30.9 g/dL (ref 30.0–36.0)
MCV: 82.1 fL (ref 80.0–100.0)
Platelets: 233 10*3/uL (ref 150–400)
RBC: 3.12 MIL/uL — ABNORMAL LOW (ref 3.87–5.11)
RDW: 13.4 % (ref 11.5–15.5)
WBC: 10.7 10*3/uL — ABNORMAL HIGH (ref 4.0–10.5)
nRBC: 0 % (ref 0.0–0.2)

## 2019-12-19 LAB — HEMOGLOBIN AND HEMATOCRIT, BLOOD
HCT: 24 % — ABNORMAL LOW (ref 36.0–46.0)
HCT: 28.1 % — ABNORMAL LOW (ref 36.0–46.0)
Hemoglobin: 7.9 g/dL — ABNORMAL LOW (ref 12.0–15.0)
Hemoglobin: 8.7 g/dL — ABNORMAL LOW (ref 12.0–15.0)

## 2019-12-19 MED ORDER — IBUPROFEN 600 MG PO TABS: 600.0000 mg | ORAL_TABLET | Freq: Four times a day (QID) | ORAL | 0 refills | Status: AC

## 2019-12-19 MED ORDER — FERROUS SULFATE 325 (65 FE) MG PO TABS: 325.0000 mg | ORAL_TABLET | Freq: Two times a day (BID) | ORAL | 1 refills | Status: AC

## 2019-12-19 MED ORDER — NORGESTIMATE-ETH ESTRADIOL 0.25-35 MG-MCG PO TABS: 1.0000 | ORAL_TABLET | Freq: Every day | ORAL | 11 refills | Status: DC

## 2019-12-19 NOTE — Progress Notes (Signed)
Subjective:   Patient in bed. Feels fatigued. Has tolerated crackers. Feels weak, minimal vaginal bleeding.  Objective:  Blood pressure (!) 94/50, pulse 83, temperature 98.1 F (36.7 C), temperature source Oral, resp. rate 18, height 5\' 2"  (1.575 m), weight 65.8 kg, SpO2 97 %.  General: NAD Pulmonary: no increased work of breathing Abdomen: non-distended, non-tender, fundus firm at level of umbilicus Extremities: no edema, no erythema, no tenderness   Results for orders placed or performed during the hospital encounter of 12/18/19 (from the past 72 hour(s))  CBC     Status: Abnormal   Collection Time: 12/18/19  6:13 PM  Result Value Ref Range   WBC 10.3 4.0 - 10.5 K/uL   RBC 4.18 3.87 - 5.11 MIL/uL   Hemoglobin 10.7 (L) 12.0 - 15.0 g/dL   HCT 12/20/19 (L) 36 - 46 %   MCV 78.7 (L) 80.0 - 100.0 fL   MCH 25.6 (L) 26.0 - 34.0 pg   MCHC 32.5 30.0 - 36.0 g/dL   RDW 32.2 02.5 - 42.7 %   Platelets 362 150 - 400 K/uL   nRBC 0.0 0.0 - 0.2 %    Comment: Performed at [facility], [address] [place], [place]  hCG, quantitative, pregnancy     Status: Abnormal   Collection Time: 12/18/19  6:13 PM  Result Value Ref Range   hCG, Beta Chain, Quant, S 270 (H) <5 mIU/mL    Comment:          GEST. AGE      CONC.  (mIU/mL)   <=1 WEEK        5 - 50     2 WEEKS       50 - 500     3 WEEKS       100 - 10,000     4 WEEKS     1,000 - 30,000     5 WEEKS     3,500 - 115,000   6-8 WEEKS     12,000 - 270,000    12 WEEKS     15,000 - 220,000        FEMALE AND NON-PREGNANT FEMALE:     LESS THAN 5 mIU/mL Performed at [facility], [address] [place], [place]   Basic metabolic panel     Status: Abnormal   Collection Time: 12/18/19  6:13 PM  Result Value Ref Range   Sodium 135 135 - 145 mmol/L   Potassium 3.2 (L) 3.5 - 5.1 mmol/L   Chloride 103 98 - 111 mmol/L   CO2 20 (L) 22 - 32 mmol/L   Glucose, Bld 186 (H) 70 - 99 mg/dL    Comment: Glucose  reference range applies only to samples taken after fasting for at least 8 hours.   BUN 13 6 - 20 mg/dL   Creatinine, Ser 12/20/19 0.44 - 1.00 mg/dL   Calcium 9.1 8.9 - 6.16 mg/dL   GFR calc non Af Amer >60 >60 mL/min   GFR calc Af Amer >60 >60 mL/min   Anion gap 12 5 - 15    Comment: Performed at [facility], [address] [place], [place]  Type and screen [facility]     Status: None (Preliminary result)   Collection Time: 12/18/19  6:13 PM  Result Value Ref Range   ABO/RH(D) PENDING    Antibody Screen PENDING    Sample Expiration      12/21/2019,2359 Performed  at [facility], [address] [place], [place] [address]   Glucose, capillary     Status: Abnormal   Collection Time: 12/18/19  6:22 PM  Result Value Ref Range   Glucose-Capillary 202 (H) 70 - 99 mg/dL    Comment: Glucose reference range applies only to samples taken after fasting for at least 8 hours.  Type and screen Ordered by PROVIDER DEFAULT     Status: None   Collection Time: 12/18/19  7:02 PM  Result Value Ref Range   ABO/RH(D) O POS    Antibody Screen NEG    Sample Expiration      12/21/2019,2359 Performed at [facility], [address] [place], [place] [address]   SARS Coronavirus 2 by RT PCR (hospital order, performed in [facility] hospital lab) Nasopharyngeal Nasopharyngeal Swab     Status: None   Collection Time: 12/18/19  7:02 PM   Specimen: Nasopharyngeal Swab  Result Value Ref Range   SARS Coronavirus 2 NEGATIVE NEGATIVE    Comment: (NOTE) SARS-CoV-2 target nucleic acids are NOT DETECTED.  The SARS-CoV-2 RNA is generally detectable in upper and lower respiratory specimens during the acute phase of infection. The lowest concentration of SARS-CoV-2 viral copies this assay can detect is 250 copies / mL. A negative result does not preclude SARS-CoV-2 infection and should not be used as the sole basis for treatment or other patient  management decisions.  A negative result may occur with improper specimen collection / handling, submission of specimen other than nasopharyngeal swab, presence of viral mutation(s) within the areas targeted by this assay, and inadequate number of viral copies (<250 copies / mL). A negative result must be combined with clinical observations, patient history, and epidemiological information.  Fact Sheet for Patients:   BoilerBrush.com.cy  Fact Sheet for Healthcare Providers: https://pope.com/  This test is not yet approved or  cleared by the [place] FDA and has been authorized for detection and/or diagnosis of SARS-CoV-2 by FDA under an Emergency Use Authorization (EUA).  This EUA will remain in effect (meaning this test can be used) for the duration of the COVID-19 declaration under Section 564(b)(1) of the Act, 21 U.S.C. section 360bbb-3(b)(1), unless the authorization is terminated or revoked sooner.  Performed at [facility], [address] [place], [place] [address]   Hemoglobin and hematocrit, blood     Status: Abnormal   Collection Time: 12/18/19 11:51 PM  Result Value Ref Range   Hemoglobin 8.7 (L) 12.0 - 15.0 g/dL   HCT 45.3 (L) 36 - 46 %    Comment: Performed at [facility], [address] [place], [place] [address]  CBC     Status: Abnormal   Collection Time: 12/19/19  5:12 AM  Result Value Ref Range   WBC 10.7 (H) 4.0 - 10.5 K/uL   RBC 3.12 (L) 3.87 - 5.11 MIL/uL   Hemoglobin 7.9 (L) 12.0 - 15.0 g/dL   HCT 32.1 (L) 36 - 46 %   MCV 82.1 80.0 - 100.0 fL   MCH 25.3 (L) 26.0 - 34.0 pg   MCHC 30.9 30.0 - 36.0 g/dL   RDW 22.4 82.5 - 00.3 %   Platelets 233 150 - 400 K/uL   nRBC 0.0 0.0 - 0.2 %    Comment: Performed at [facility], [address], [place], [place] [address]     Assessment:   34 y.o.  S/p D&C for incomplete abortion  Plan:  1) Acute blood loss  anemia -  hemodynamically stable and asymptomatic - po ferrous sulfate - iron rich foods discussed -Will recheck CBC at noon. If patient reports continued dizziness or hgb less than 7.0 will consider blood transfusion  2) Blood Type --/--/O POS (07/16 1902)   3) Motrin/tylenol/ tramadol PRN pain  4) Anticipate discharge later today.   Adelene Idler MD, [facility], [facility] 12/19/2019 6:51 AM

## 2019-12-19 NOTE — Discharge Summary (Signed)
Physician Discharge Summary  Patient ID: Judy Clark MRN: [id number] DOB/AGE: 34/05/1986 34 y.o.  Admit date: 12/18/2019 Discharge date: 12/19/2019  Admission Diagnoses: Incomplete abortion with hemorrhage  Discharge Diagnoses:  Active Problems:   Incomplete abortion with delayed or excessive hemorrhage   Retained products of conception with hemorrhage   S/P dilation and curettage   Discharged Condition: stable  Hospital Course: 34 y.o. presenting with incomplete abortion following medical management with misoprostol.  Patient was taken for emergent D&C 12/18/2019 which was uncomplicated.  Following D&C serial CBC reveal Hgb nadir at 7.9, stable on recheck.  Vitals signs remained stable postoperatively with minimal bleeding.  The patient was ambulating, tolerating po, and voiding at the time of discharge.  The patient has not intended for pregnancy at this time.  Will be started on OCP for contraception.  Consults: None  Significant Diagnostic Studies:  Results for orders placed or performed during the hospital encounter of 12/18/19 (from the past 24 hour(s))  CBC     Status: Abnormal   Collection Time: 12/18/19  6:13 PM  Result Value Ref Range   WBC 10.3 4.0 - 10.5 K/uL   RBC 4.18 3.87 - 5.11 MIL/uL   Hemoglobin 10.7 (L) 12.0 - 15.0 g/dL   HCT 41.3 (L) 36 - 46 %   MCV 78.7 (L) 80.0 - 100.0 fL   MCH 25.6 (L) 26.0 - 34.0 pg   MCHC 32.5 30.0 - 36.0 g/dL   RDW 24.4 01.0 - 27.2 %   Platelets 362 150 - 400 K/uL   nRBC 0.0 0.0 - 0.2 %  hCG, quantitative, pregnancy     Status: Abnormal   Collection Time: 12/18/19  6:13 PM  Result Value Ref Range   hCG, Beta Chain, Quant, S 270 (H) <5 mIU/mL  Basic metabolic panel     Status: Abnormal   Collection Time: 12/18/19  6:13 PM  Result Value Ref Range   Sodium 135 135 - 145 mmol/L   Potassium 3.2 (L) 3.5 - 5.1 mmol/L   Chloride 103 98 - 111 mmol/L   CO2 20 (L) 22 - 32 mmol/L   Glucose, Bld 186 (H) 70 - 99 mg/dL   BUN 13 6  - 20 mg/dL   Creatinine, Ser 5.36 0.44 - 1.00 mg/dL   Calcium 9.1 8.9 - 64.4 mg/dL   GFR calc non Af Amer >60 >60 mL/min   GFR calc Af Amer >60 >60 mL/min   Anion gap 12 5 - 15  Type and screen [facility]     Status: None (Preliminary result)   Collection Time: 12/18/19  6:13 PM  Result Value Ref Range   ABO/RH(D) PENDING    Antibody Screen PENDING    Sample Expiration      12/21/2019,2359 Performed at [facility], [address] [place], [place] [address]   Glucose, capillary     Status: Abnormal   Collection Time: 12/18/19  6:22 PM  Result Value Ref Range   Glucose-Capillary 202 (H) 70 - 99 mg/dL  Type and screen Ordered by PROVIDER DEFAULT     Status: None   Collection Time: 12/18/19  7:02 PM  Result Value Ref Range   ABO/RH(D) O POS    Antibody Screen NEG    Sample Expiration      12/21/2019,2359 Performed at [facility], [address] [place], [place] [address]   SARS Coronavirus 2 by RT PCR (hospital order, performed in [facility] hospital lab) Nasopharyngeal Nasopharyngeal  Swab     Status: None   Collection Time: 12/18/19  7:02 PM   Specimen: Nasopharyngeal Swab  Result Value Ref Range   SARS Coronavirus 2 NEGATIVE NEGATIVE  Hemoglobin and hematocrit, blood     Status: Abnormal   Collection Time: 12/18/19 11:51 PM  Result Value Ref Range   Hemoglobin 8.7 (L) 12.0 - 15.0 g/dL   HCT 42.5 (L) 36 - 46 %  CBC     Status: Abnormal   Collection Time: 12/19/19  5:12 AM  Result Value Ref Range   WBC 10.7 (H) 4.0 - 10.5 K/uL   RBC 3.12 (L) 3.87 - 5.11 MIL/uL   Hemoglobin 7.9 (L) 12.0 - 15.0 g/dL   HCT 95.6 (L) 36 - 46 %   MCV 82.1 80.0 - 100.0 fL   MCH 25.3 (L) 26.0 - 34.0 pg   MCHC 30.9 30.0 - 36.0 g/dL   RDW 38.7 56.4 - 33.2 %   Platelets 233 150 - 400 K/uL   nRBC 0.0 0.0 - 0.2 %  Hemoglobin and hematocrit, blood     Status: Abnormal   Collection Time: 12/19/19 11:44 AM  Result Value Ref Range   Hemoglobin 7.9 (L)  12.0 - 15.0 g/dL   HCT 95.1 (L) 36 - 46 %   No results found.   Treatments: IV hydration and surgery: Suction D&C by Dr. Jerene Pitch 12/18/2019  Discharge Exam: Blood pressure 110/71, pulse 85, temperature 98.3 F (36.8 C), resp. rate 18, height 5\' 2"  (1.575 m), weight 65.8 kg, SpO2 99 %. General appearance: alert, appears stated age and no distress Resp: clear to auscultation bilaterally GI: soft, non-tender; bowel sounds normal; no masses,  no organomegaly Extremities: extremities normal, atraumatic, no cyanosis or edema  Disposition: Discharge disposition: 01-Home or Self Care       Discharge Instructions    Activity as tolerated   Complete by: As directed    Call MD for:   Complete by: As directed    For heavy vaginal bleeding greater than 1 pad an hour   Call MD for:  difficulty breathing, headache or visual disturbances   Complete by: As directed    Call MD for:  difficulty breathing, headache or visual disturbances   Complete by: As directed    Call MD for:  extreme fatigue   Complete by: As directed    Call MD for:  extreme fatigue   Complete by: As directed    Call MD for:  hives   Complete by: As directed    Call MD for:  hives   Complete by: As directed    Call MD for:  persistant dizziness or light-headedness   Complete by: As directed    Call MD for:  persistant dizziness or light-headedness   Complete by: As directed    Call MD for:  persistant nausea and vomiting   Complete by: As directed    Call MD for:  persistant nausea and vomiting   Complete by: As directed    Call MD for:  redness, tenderness, or signs of infection (pain, swelling, redness, odor or green/yellow discharge around incision site)   Complete by: As directed    Call MD for:  severe uncontrolled pain   Complete by: As directed    Call MD for:  severe uncontrolled pain   Complete by: As directed    Call MD for:  temperature >100.4   Complete by: As directed    Call MD for:   temperature >  100.4   Complete by: As directed    Diet general   Complete by: As directed    Diet general   Complete by: As directed    Driving Restrictions   Complete by: As directed    Do not drive while taking narcotic medications   Increase activity slowly   Complete by: As directed    May shower / Bathe   Complete by: As directed      Allergies as of 12/19/2019   Not on File     Medication List    TAKE these medications   acetaminophen 325 MG tablet Commonly known as: TYLENOL Take by mouth.   ferrous sulfate 325 (65 FE) MG tablet Commonly known as: FerrouSul Take 1 tablet (325 mg total) by mouth 2 (two) times daily.   ibuprofen 600 MG tablet Commonly known as: ADVIL Take by mouth. What changed: Another medication with the same name was added. Make sure you understand how and when to take each.   ibuprofen 600 MG tablet Commonly known as: ADVIL Take 1 tablet (600 mg total) by mouth every 6 (six) hours. What changed: You were already taking a medication with the same name, and this prescription was added. Make sure you understand how and when to take each.   norgestimate-ethinyl estradiol 0.25-35 MG-MCG tablet Commonly known as: ORTHO-CYCLEN Take 1 tablet by mouth daily.   oxyCODONE 5 MG immediate release tablet Commonly known as: Oxy IR/ROXICODONE Take by mouth.   traMADol 50 MG tablet Commonly known as: Ultram Take 1 tablet (50 mg total) by mouth every 6 (six) hours as needed.       Follow-up Information    Schuman, Jaquelyn Bitter, MD. Schedule an appointment as soon as possible for a visit in 1 week.   Specialty: Obstetrics and Gynecology Contact information: [address] [place] [address] [phone]               Signed: Vena Austria 12/19/2019, 1:09 PM

## 2019-12-19 NOTE — Progress Notes (Signed)
RN in room getting report and doing assessment; RN has interpreter ipad in room; pt reports needing to go to the bathroom (when pt in PACU, attempted to void on bedpan); RN slowly got pt to sitting position, then standing position; RN assisted pt to bathroom; pt's significant other assisting too; pt able to ambulate to bathroom and able to void; RN assisted pt changing underwear and pad; as pt getting ready to ambulate back to bed, pt appeared pale; RN had pt sit back down on toilet; pt got sweaty and leaned back into significant other's arms (significant other stood behind pt while she voided); pt appeared to almost pass out; RN pulled bathroom cord; ammonia brought to bathroom; pt smelled ammonia and was alert; pt was able to talk to RN (with interpreter ipad, RN had pt tell RN her name and where she was); pt alert but still feeling lightheaded and like she might pass out; RN had several other RNs getting supplies and helping; RN assisted pt to wheelchair and wheeled pt back to bed; pt back in bed and VS taken; (VS checked several times after this); with interpreter ipad, RN expressed how very important it is that pt not get out of bed by herself at all; pt understands; pt did drink apple juice and eat some saltine crackers

## 2019-12-19 NOTE — Discharge Instructions (Signed)
Dieta con alto contenido de hierro Iron-Rich Diet  El hierro es un mineral que ayuda al organismo a producir hemoglobina. La hemoglobina es una protena de los glbulos rojos que transporta el oxgeno a los tejidos del cuerpo. Consumir muy poco hierro [employer] se sienta dbil y [place], y aumentar su riesgo de contraer infecciones. El hierro es un componente natural de muchos alimentos y muchos otros alimentos tienen hierro agregado (alimentos fortificados con hierro). Es posible que deba seguir una dieta con alto contenido de hierro si no tiene suficiente hierro en el cuerpo debido a Theatre manager. La cantidad de potasio que necesita diariamente depende de su edad, su sexo y las afecciones que pueda [place]. Siga las indicaciones de su mdico o un especialista en alimentacin y nutricin (nutricionista) sobre la cantidad de hierro que debera consumir Management consultant. Cules son algunos consejos para seguir este plan? Leer las etiquetas de los alimentos  Lea las etiquetas de los alimentos para saber la cantidad de miligramos (mg) de hierro que hay en cada porcin. Al cocinar  Cocine los alimentos en ollas de hierro.  Tome estas medidas para que el cuerpo pueda absorber el hierro de ciertos alimentos con ms facilidad: ? Antes de cocinarlos, remoje los frijoles durante la noche. ? Remoje los cereales integrales durante la noche y culelos antes de usarlos para cocinar. ? Prepare un fermento con las harinas antes del horneado, por ejemplo, usando levadura en la masa del pan. Planificacin de las comidas  Cuando coma alimentos que contengan hierro, debe comerlos con alimentos ricos en vitamina C. Entre ellos, se incluyen las [place], los pimientos, los tomates, las papas y Social worker. La vitamina C ayuda al organismo a Set designer. Informacin general  Tome los suplementos de hierro solamente como se lo haya indicado el mdico. La sobredosis de hierro puede ser potencialmente mortal.  Si le recetan suplementos de hierro, tmelos con jugo de naranja o un suplemento de vitaminaC.  Cuando coma alimentos fortificados con hierro o tome un suplemento de hierro, tambin debe consumir alimentos que contengan hierro naturalmente, como carne, aves y pescado. Consumir alimentos ricos en hierro naturalmente ayuda al organismo a absorber el hierro que se aade a otros alimentos o que contiene un suplemento.  Ciertos alimentos y bebidas impiden que el cuerpo absorba el hierro adecuadamente. No consuma estos alimentos en la misma comida que aquellos con alto contenido de hierro o con suplementos de Company secretary. Estos alimentos incluyen: ? Caf, t negro y vino tinto. ? Leche, productos lcteos y alimentos con alto contenido de calcio. ? Porotos y soja. ? Cereales integrales. Qu tipos de alimentos debo consumir? Frutas Ciruelas pasas. Pasas de uva. Consuma frutas con alto contenido de vitamina C, por ejemplo, naranjas, pomelos y fresas, junto con alimentos ricos en hierro. Verduras [place] (cocida). Guisantes. Brcoli. Verduras fermentadas. Consuma verduras ricas en vitamina C, como las verduras de [place], las papas, los morrones y los tomates, junto con los alimentos ricos en hierro. Cereales Cereales para el desayuno fortificados con hierro. Pan de trigo integral fortificado con hierro. Arroz enriquecido. Granos germinados. Carnes y otras protenas Hgado de res. Ostras. Carne de vaca. Camarones. Pavo. Pollo. Atn. Sardinas. Garbanzos. Frutos secos. Tofu. Semillas de calabaza. Bebidas Jugo de tomate. Jugo de naranja recin exprimido. Jugo de ciruelas. T de hibisco. Batidos instantneos fortificados para el desayuno. Dulces y Statistician. Alios y condimentos Tahini. Salsa de soja fermentada. Otros alimentos Germen de trigo. Es posible que los productos mencionados [place]  no formen [employer] de las bebidas o los alimentos recomendados. Comunquese con un nutricionista  para obtener ms informacin. Qu alimentos debo evitar? Cereales Cereales integrales. Cereal de salvado. Harina de salvado. Avena. Carnes y [address]. Productos elaborados a base de protena de la soja. Frijoles negros. Lentejas. Frijoles mungo. Guisantes secos. Lcteos Leche. Crema. Queso. Yogur. Requesn. Bebidas Caf. T negro. Vino tinto. Dulces y [employer]. Chocolate. Helados. Otros alimentos [place]. Organo. Grandes cantidades de perejil. Es posible que los productos que se enumeran ms arriba no sean una lista completa de los alimentos y las bebidas que se Theatre stage manager. Comunquese con un nutricionista para obtener ms informacin. Resumen  El hierro es un mineral que ayuda al organismo a producir hemoglobina. La hemoglobina es una protena de los glbulos rojos que transporta el oxgeno a los tejidos del cuerpo.  El hierro es un componente natural de muchos alimentos y muchos otros alimentos tienen hierro agregado (alimentos fortificados con hierro).  Cuando coma alimentos que contengan hierro, debe comerlos con alimentos ricos en vitamina C. La vitamina C ayuda al organismo a Set designer.  Ciertos alimentos y bebidas impiden que el cuerpo absorba el hierro [address], como los cereales integrales y los productos lcteos. Debe evitar consumir estos alimentos en la misma comida que aquellos con alto contenido de hierro o con suplementos de hierro. Esta informacin no tiene Theme park manager el consejo del mdico. Asegrese de hacerle al mdico cualquier pregunta que tenga. Document Revised: 07/30/2017 Document Reviewed: 07/30/2017 Elsevier Patient Education  2020 Elsevier Inc.    CIRUGIA AMBULATORIA       Instruccionnes de alta    12/18/2019   1.  Las drogas que se Dispensing optician en su cuerpo [employer], asi            que por las proximas 24 horas usted no debe:   Conducir Field seismologist) un automovil   Hacer ninguna decision  legal   Tomar ninguna bebida alcoholica  2.  A) [place] puede comenzar una dieta regular.  Es mejor que hoy empiece con           liquidos y gradualmente anada [address]       B) Puede comer cualquier comida que desee pero es mejor empezar con liquidos,                      luego sopitas con galletas saladas y gradualmente llegar a las comidas solidas.  3.  Por favor avise a su medico inmediatamente si usted tiene algun sangrado anormal,       tiene dificultad con la respiracion, enrojecimiento y Engineer, mining en el sitio de la cirugia, [place],       fiebro o dolor que se alivia con [place].  4.  A) Su visita posoperatoria (despues de su operacion) es con el  1 week call and make appt       B)  Por favor llame para hacer la cita posoperatoria.  5.  Istrucciones especificas :

## 2019-12-19 NOTE — Progress Notes (Signed)
Discharge instructions complete and prescriptions sent to the pharmacy by the provider. Patient verbalizes understanding of teaching. Patient discharged home via wheelchair at 1550.

## 2019-12-20 NOTE — Anesthesia Postprocedure Evaluation (Signed)
Anesthesia Post Note  Patient: Judy Clark  Procedure(s) Performed: DILATATION AND EVACUATION (N/A )  Patient location during evaluation: PACU Anesthesia Type: General Level of consciousness: awake and alert Pain management: pain level controlled Vital Signs Assessment: post-procedure vital signs reviewed and stable Respiratory status: spontaneous breathing, nonlabored ventilation, respiratory function stable and patient connected to nasal cannula oxygen Cardiovascular status: blood pressure returned to baseline and stable Postop Assessment: no apparent nausea or vomiting Anesthetic complications: no   No complications documented.   Last Vitals:  Vitals:   12/19/19 1119 12/19/19 1506  BP: 110/71 (!) 103/56  Pulse: 85 97  Resp: 18 18  Temp: 36.8 C 36.4 C  SpO2: 99% 99%    Last Pain:  Vitals:   12/19/19 1506  TempSrc: Oral  PainSc:                  Lenard Simmer

## 2019-12-21 ENCOUNTER — Encounter: Payer: Self-pay | Admitting: Obstetrics and Gynecology

## 2019-12-22 LAB — SURGICAL PATHOLOGY

## 2019-12-23 ENCOUNTER — Ambulatory Visit (INDEPENDENT_AMBULATORY_CARE_PROVIDER_SITE_OTHER): Payer: Self-pay | Admitting: Obstetrics and Gynecology

## 2019-12-23 ENCOUNTER — Other Ambulatory Visit: Payer: Self-pay

## 2019-12-23 ENCOUNTER — Encounter: Payer: Self-pay | Admitting: Obstetrics and Gynecology

## 2019-12-23 VITALS — BP 116/72 | HR 69 | Resp 16 | Wt 138.0 lb

## 2019-12-23 DIAGNOSIS — D62 Acute posthemorrhagic anemia: Secondary | ICD-10-CM

## 2019-12-23 DIAGNOSIS — Z9889 Other specified postprocedural states: Secondary | ICD-10-CM

## 2019-12-23 DIAGNOSIS — O039 Complete or unspecified spontaneous abortion without complication: Secondary | ICD-10-CM

## 2019-12-23 NOTE — Patient Instructions (Addendum)
Como sobrellevar la prdida de un embarazo Managing Pregnancy Loss La prdida del embarazo puede ocurrir en cualquier momento durante un embarazo. Generalmente no se conoce la causa. Rara vez se debe a algo que usted hizo. La prdida del embarazo a comienzos del embarazo (durante el primer trimestre) se denomina aborto espontneo. Este es el tipo de prdida de un embarazo ms frecuente. La prdida del embarazo que ocurre despus de las 20 semanas de embarazo se denomina muerte fetal si el corazn del beb deja de latir antes del nacimiento. La muerte fetal es mucho menos frecuente. Algunas mujeres experimentan trabajo de parto espontneo poco despus de la muerte fetal, lo que ocasiona el nacimiento del beb muerto. Cualquier prdida de un embarazo puede ser devastadora. Tendr que recuperarse fsica y emocionalmente. La mayora de las mujeres son capaces de quedar embarazadas nuevamente despus de la prdida de un embarazo y dar a luz un beb sano. Cmo manejar la recuperacin emocional  La prdida de un embarazo es muy difcil emocionalmente. Es posible que sienta muchas emociones distintas mientras hace el duelo. Puede sentirse triste y enojada. Tambin puede sentir culpa. Es normal tener perodos de llanto. La recuperacin emocional puede llevar ms tiempo que la recuperacin fsica. Es diferente para cada persona. Estas medidas pueden ayudarla a sobrellevar esta prdida:  Recuerde que es poco probable que haya hecho algo para causar la prdida del embarazo.  Comparta sus pensamientos y sentimientos con su pareja, familiares y amigos. Recuerde que su pareja tambin se est recuperando emocionalmente.  Asegrese de tener un buen sistema de apoyo. No pase demasiado tiempo sola.  Renase con un consejero sobre prdida de embarazos o nase a un grupo de apoyo para prdida de embarazos.  Duerma lo suficiente y siga una dieta sana. Regrese a la actividad fsica con regularidad cuando se haya  recuperado fsicamente.  No consuma drogas ni alcohol para controlar sus emociones.  Considere consultar a un profesional de salud mental para que la ayude a recuperarse emocionalmente.  Pdale a un amigo o un ser querido que la ayude a decidir qu hacer con la ropa y los artculos infantiles que recibi para el beb. En el caso de muerte fetal, muchas mujeres se benefician al tomar medidas adicionales en el proceso del duelo. Es recomendable que:  Sostenga al beb despus del nacimiento.  Le ponga un nombre al beb.  Solicite un certificado de nacimiento.  Cree un recuerdo, como las huellas de las manos o de los pies.  Vista al beb y pida que le tomen una fotografa.  Haga arreglos para un funeral.  Solicite un bautismo o una bendicin. Los hospitales tienen personal que puede ayudarla a organizar todo esto. Cmo reconocer el estrs emocional Es normal tener estrs emocional despus de perder un embarazo. Pero el estrs emocional que dura mucho tiempo o se vuelve grave requiere tratamiento. Tenga cuidado con estos signos de estrs emocional grave:  Tristeza, ira o culpa, que no desaparecen y estn interfiriendo en sus actividades normales.  Problemas de relacin que hayan surgido o empeorado desde la prdida del embarazo.  Signos de depresin que duran ms de 2 semanas. Pueden incluir: ? Tristeza. ? Ansiedad. ? Desesperanza. ? Falta de inters en las actividades que disfruta. ? Incapacidad para concentrarse. ? Dificultad para dormir o dormir demasiado. ? Prdida del apetito o comer en exceso. ? Pensamientos sobre la muerte o de hacerse dao a s misma. Siga estas instrucciones en su casa:  Use los medicamentos de venta libre   y los recetados solamente como se lo haya indicado el mdico.  Haga reposo en su casa hasta que su nivel de energa regrese. Reanude sus actividades normales segn lo indicado por el mdico. Pregntele al mdico qu actividades son seguras para  usted.  Cuando est lista, visite a su mdico para hablar sobre las medidas que debe tomar para futuros embarazos.  Concurra a todas las visitas de seguimiento como se lo haya indicado el mdico. Esto es importante. Dnde encontrar apoyo  Para que usted y su pareja puedan sobrellevar el proceso del duelo, hable con su mdico o busque apoyo psicolgico.  Considere la posibilidad de reunirse con otras mujeres que hayan sufrido la prdida de un embarazo. Consulte al mdico sobre distintos grupos de apoyo y recursos. Dnde buscar ms informacin  U.S. Department of Health and Human Services, Office on Women's Health (Departamento de Salud y Servicios Sociales de los Estados Unidos, Oficina de Salud de la Mujer): www.womenshealth.gov  American Pregnancy Association (Asociacin Americana del Embarazo): www.americanpregnancy.org. Comunquese con un mdico si:  Contina sintiendo pena, tristeza o falta de motivacin para realizar las actividades diarias, y estos sentimientos no mejoran con el tiempo.  Tiene problemas para recuperarse emocionalmente, en especial si est consumiendo alcohol o sustancias para ayudar. Solicite ayuda inmediatamente si:  Piensa en lastimarse a usted misma o a otras personas. Si alguna vez siente que puede lastimarse a usted misma o lastimar a otras personas, o tiene pensamientos de poner fin a su vida, busque ayuda de inmediato. Puede dirigirse al servicio de emergencias ms cercano o comunicarse con:  El servicio de emergencias de su localidad (911 en [place]  Una lnea de asistencia al suicida y atencin en crisis, como National Suicide Prevention Lifeline (Lnea Nacional de Prevencin del Suicidio), al 1-800-273-8255. Est disponible las 24 horas del da. Resumen  Cualquier prdida de un embarazo puede ser difcil fsica y emocionalmente.  Es posible que tenga muchas emociones distintas mientras hace el duelo. La recuperacin emocional puede durar ms tiempo  que la recuperacin fsica.  Es normal tener estrs emocional despus de perder un embarazo. Pero el estrs emocional que dura mucho tiempo o se vuelve grave requiere tratamiento.  Consulte a su mdico si tiene problemas emocionales despus de la prdida de un embarazo. Esta informacin no tiene como fin reemplazar el consejo del mdico. Asegrese de hacerle al mdico cualquier pregunta que tenga. Document Revised: 09/15/2018 Document Reviewed: 08/31/2017 Elsevier Patient Education  2020 Elsevier Inc.  

## 2019-12-23 NOTE — Progress Notes (Signed)
  Postoperative Follow-up Patient presents post op from Dilation and Evacuation for hemorrhage from incomplete abortion, 1 week ago.  Subjective: Patient reports some improvement in her preop symptoms. Eating a regular diet without difficulty. Pain is controlled without any medications.  Activity: sedentary. Patient reports additional symptom's since surgery of feeling dizzy and lightheaded with standing.  Generally symptoms resolved quickly.  Patient has had not had any fainting.  Patient did not receive a blood transfusion while in the hospital as her hemoglobin stabilized at 7.9.  Objective: BP 116/72   Pulse 69   Resp 16   Wt 138 lb (62.6 kg)   SpO2 100%   BMI 25.24 kg/m  Physical Exam Constitutional:      Appearance: She is well-developed.  Genitourinary:     Vagina and uterus normal.     No lesions in the vagina.     No cervical motion tenderness.     No right or left adnexal mass present.  HENT:     Head: Normocephalic and atraumatic.  Neck:     Thyroid: No thyromegaly.  Cardiovascular:     Rate and Rhythm: Normal rate and regular rhythm.     Heart sounds: Normal heart sounds.  Pulmonary:     Effort: Pulmonary effort is normal.     Breath sounds: Normal breath sounds.  Chest:     Breasts:        Right: No inverted nipple, mass, nipple discharge or skin change.        Left: No inverted nipple, mass, nipple discharge or skin change.  Abdominal:     General: Bowel sounds are normal. There is no distension.     Palpations: Abdomen is soft. There is no mass.  Musculoskeletal:     Cervical back: Neck supple.  Neurological:     Mental Status: She is alert and oriented to person, place, and time.  Skin:    General: Skin is warm and dry.  Psychiatric:        Behavior: Behavior normal.        Thought Content: Thought content normal.        Judgment: Judgment normal.  Vitals reviewed.     Assessment: s/p :  Dilation and Evacuation stable  Plan: Patient has done  well after surgery with no apparent complications.  I have discussed the post-operative course to date, and the expected progress moving forward.  The patient understands what complications to be concerned about.  I will see the patient in routine follow up, or sooner if needed.    Orthostatics within normal limits.  Will check H&H given patient's reported symptoms of dizziness while standing and with minimal activity.  Discussed the patient in detail in the hospital iron supplementation to help improve her current anemia.  Discussed iron rich diet.  We will follow up with the patient in several weeks.  Patient is taking an oral contraceptive pill which was prescribed by [facility] for birth control.  Activity plan: No heavy lifting. Pelvic rest for 2 weeks.  Azya Barbero R Kuron Docken 12/23/2019, 2:14 PM

## 2019-12-24 LAB — HEMOGLOBIN AND HEMATOCRIT, BLOOD
Hematocrit: 26.6 % — ABNORMAL LOW (ref 34.0–46.6)
Hemoglobin: 8.4 g/dL — ABNORMAL LOW (ref 11.1–15.9)

## 2019-12-24 LAB — SURGICAL PATHOLOGY

## 2019-12-24 LAB — FERRITIN: Ferritin: 67 ng/mL (ref 15–150)

## 2019-12-28 ENCOUNTER — Telehealth: Payer: Self-pay

## 2019-12-28 NOTE — Telephone Encounter (Signed)
Pt calling for blood test results.  [phone]

## 2019-12-28 NOTE — Telephone Encounter (Signed)
Pt aware.

## 2019-12-28 NOTE — Telephone Encounter (Signed)
Hemoglobin and ferritin improved. Please let patient know.

## 2020-01-20 ENCOUNTER — Encounter: Payer: Self-pay | Admitting: Obstetrics and Gynecology

## 2020-01-20 ENCOUNTER — Ambulatory Visit (INDEPENDENT_AMBULATORY_CARE_PROVIDER_SITE_OTHER): Payer: Self-pay | Admitting: Obstetrics and Gynecology

## 2020-01-20 ENCOUNTER — Other Ambulatory Visit: Payer: Self-pay

## 2020-01-20 VITALS — BP 100/70 | Ht 60.0 in | Wt 143.8 lb

## 2020-01-20 DIAGNOSIS — Z9889 Other specified postprocedural states: Secondary | ICD-10-CM

## 2020-01-20 DIAGNOSIS — O039 Complete or unspecified spontaneous abortion without complication: Secondary | ICD-10-CM

## 2020-01-20 NOTE — Progress Notes (Signed)
  Postoperative Follow-up Patient presents post op from D&E for incomplete miscarriage, 1 month ago.  Subjective: Patient reports marked improvement in her preop symptoms. Eating a regular diet without difficulty. Pain is controlled without any medications.  Activity: normal activities of daily living. Patient reports additional symptom's since surgery of None.  Objective: There were no vitals taken for this visit. Physical Exam Constitutional:      Appearance: She is well-developed.  Genitourinary:     Vagina and uterus normal.     No lesions in the vagina.     No cervical motion tenderness.     No right or left adnexal mass present.  HENT:     Head: Normocephalic and atraumatic.  Neck:     Thyroid: No thyromegaly.  Cardiovascular:     Rate and Rhythm: Normal rate and regular rhythm.     Heart sounds: Normal heart sounds.  Pulmonary:     Effort: Pulmonary effort is normal.     Breath sounds: Normal breath sounds.  Chest:     Breasts:        Right: No inverted nipple, mass, nipple discharge or skin change.        Left: No inverted nipple, mass, nipple discharge or skin change.  Abdominal:     General: Bowel sounds are normal. There is no distension.     Palpations: Abdomen is soft. There is no mass.  Musculoskeletal:     Cervical back: Neck supple.  Neurological:     Mental Status: She is alert and oriented to person, place, and time.  Skin:    General: Skin is warm and dry.  Psychiatric:        Behavior: Behavior normal.        Thought Content: Thought content normal.        Judgment: Judgment normal.  Vitals reviewed.     Assessment: s/p :  D&E stable  Plan: Patient has done well after surgery with no apparent complications.  I have discussed the post-operative course to date, and the expected progress moving forward.  The patient understands what complications to be concerned about.  I will see the patient in routine follow up, or sooner if needed.    Activity  plan: No restriction.  Patient plans to follow up with health department for Depo Injection and Pap smears.    Delta Deshmukh R Jalene Lacko 01/20/2020, 10:19 AM

## 2020-01-27 DIAGNOSIS — K760 Fatty (change of) liver, not elsewhere classified: Secondary | ICD-10-CM

## 2020-01-28 ENCOUNTER — Ambulatory Visit: Payer: Self-pay

## 2020-02-02 ENCOUNTER — Other Ambulatory Visit: Payer: Self-pay

## 2020-02-02 ENCOUNTER — Ambulatory Visit (LOCAL_COMMUNITY_HEALTH_CENTER): Payer: Self-pay | Admitting: Advanced Practice Midwife

## 2020-02-02 ENCOUNTER — Encounter: Payer: Self-pay | Admitting: Advanced Practice Midwife

## 2020-02-02 VITALS — BP 119/82 | Ht 60.0 in | Wt 142.4 lb

## 2020-02-02 DIAGNOSIS — Z30013 Encounter for initial prescription of injectable contraceptive: Secondary | ICD-10-CM

## 2020-02-02 DIAGNOSIS — Z3009 Encounter for other general counseling and advice on contraception: Secondary | ICD-10-CM

## 2020-02-02 DIAGNOSIS — E663 Overweight: Secondary | ICD-10-CM | POA: Insufficient documentation

## 2020-02-02 LAB — PREGNANCY, URINE: Preg Test, Ur: NEGATIVE

## 2020-02-02 LAB — HEMOGLOBIN, FINGERSTICK: Hemoglobin: 12.1 g/dL (ref 11.1–15.9)

## 2020-02-02 LAB — WET PREP FOR TRICH, YEAST, CLUE
Trichomonas Exam: NEGATIVE
Yeast Exam: NEGATIVE

## 2020-02-02 MED ORDER — MEDROXYPROGESTERONE ACETATE 150 MG/ML IM SUSP
150.0000 mg | INTRAMUSCULAR | Status: AC
  Administered 2020-02-02 – 2020-04-20 (×2): 150 mg via INTRAMUSCULAR

## 2020-02-02 NOTE — Progress Notes (Signed)
[employer] results reviewed. Per standing orders no treatment indicated. Hgb 12.1. Depo given and patient tolerated well. Tawny Hopping, RN

## 2020-02-02 NOTE — Progress Notes (Signed)
Here today for PE and Depo. Last PE here was 06/13/2017, last Pap Smear was 08/16/2016. CBE due 2020. Declines STD screening today. Tawny Hopping, RN

## 2020-02-02 NOTE — Progress Notes (Signed)
[facility] [address] Main Number: [phone]    Family Planning Visit- Initial Visit  Subjective:  Judy Clark is a 34 y.o. MHF nonsmoker  [id number]   being seen today for an initial well woman visit and to discuss family planning options.  She is currently using None for pregnancy prevention. Patient reports she does not want a pregnancy in the next year.  Patient has the following medical conditions has Incomplete abortion with delayed or excessive hemorrhage; Retained products of conception with hemorrhage; S/P dilation and curettage; DM type 2 (diabetes mellitus, type 2) (HCC); and Fatty liver on their problem list.  Chief Complaint  Patient presents with  . Contraception    Physical and Depo    Patient reports LMP 01/18/20.  Last sex 01/30/20 with condom; with current partner x 13 years; 1 sex partner in last 3 mo.  Last pap 07/24/16 neg HPV neg.  Last PE 06/13/17.  SAB with D&C 12/18/19.  Patient denies cigs, ETOH, MJ  Body mass index is 27.81 kg/m. - Patient is eligible for diabetes screening based on BMI and age >90?  not applicable HA1C ordered? not applicable  Patient reports 1 of partners in last year. Desires STI screening?  Yes  Has patient been screened once for HCV in the past?  No  No results found for: HCVAB  Does the patient have current drug use (including MJ), have a partner with drug use, and/or has been incarcerated since last result? No  If yes-- Screen for HCV through [facility]   Does the patient meet criteria for HBV testing? No  Criteria:  -Household, sexual or needle sharing contact with HBV -History of drug use -HIV positive -Those with known Hep C   Health Maintenance Due  Topic Date Due  . HEMOGLOBIN A1C  Never done  . Hepatitis C Screening  Never done  . PNEUMOCOCCAL POLYSACCHARIDE VACCINE AGE 72-64 HIGH RISK  Never done  . FOOT EXAM  Never done  . OPHTHALMOLOGY  EXAM  Never done  . URINE MICROALBUMIN  Never done  . COVID-19 Vaccine (1) Never done  . TETANUS/TDAP  09/09/2019  . INFLUENZA VACCINE  01/03/2020    Review of Systems  All other systems reviewed and are negative.   The following portions of the patient's history were reviewed and updated as appropriate: allergies, current medications, past family history, past medical history, past social history, past surgical history and problem list. Problem list updated.   See flowsheet for other program required questions.  Objective:   Vitals:   02/02/20 1010  BP: 119/82  Weight: 142 lb 6.4 oz (64.6 kg)  Height: 5' (1.524 m)    Physical Exam Constitutional:      Appearance: Normal appearance. She is normal weight.  HENT:     Head: Normocephalic and atraumatic.     Mouth/Throat:     Mouth: Mucous membranes are moist.  Eyes:     Conjunctiva/sclera: Conjunctivae normal.  Cardiovascular:     Rate and Rhythm: Normal rate and regular rhythm.  Pulmonary:     Effort: Pulmonary effort is normal.     Breath sounds: Normal breath sounds.  Chest:     Breasts:        Right: Normal.        Left: Normal.  Abdominal:     Palpations: Abdomen is soft.     Comments: Soft without tenderness  Genitourinary:    General:  Normal vulva.     Exam position: Lithotomy position.     Vagina: Vaginal discharge (thick white leukorrhea, ph<4.5) present.     Cervix: Normal.     Uterus: Normal.      Adnexa: Right adnexa normal and left adnexa normal.     Rectum: Normal.     Comments: Pt desires GC/Chlamydia Musculoskeletal:        General: Normal range of motion.     Cervical back: Normal range of motion and neck supple.  Skin:    General: Skin is warm and dry.  Neurological:     Mental Status: She is alert.  Psychiatric:        Mood and Affect: Mood normal.       Assessment and Plan:  Judy Clark is a 34 y.o. G106P3 nonsmoker female presenting to the [facility]  Department for an initial well woman exam/family planning visit  Contraception counseling: Reviewed all forms of birth control options in the tiered based approach. available including abstinence; over the counter/barrier methods; hormonal contraceptive medication including pill, patch, ring, injection,contraceptive implant, ECP; hormonal and nonhormonal IUDs; permanent sterilization options including vasectomy and the various tubal sterilization modalities. Risks, benefits, and typical effectiveness rates were reviewed.  Questions were answered.  Written information was also given to the patient to review.  Patient desires DMPA, this was prescribed for patient. She will follow up in 11-13 wks for surveillance.  She was told to call with any further questions, or with any concerns about this method of contraception.  Emphasized use of condoms 100% of the time for STI prevention.  Patient was offered ECP. ECP was not accepted by the patient. ECP counseling was not given - see RN documentation  1. Family planning Treat wet mount per standing orders Immunization nurse consult - WET PREP FOR TRICH, YEAST, CLUE - Pregnancy, urine - Hemoglobin, venipuncture - Chlamydia/Gonorrhea [facility]  2. Encounter for initial prescription of injectable contraceptive If PT neg today may have DMPA 150 mg IM q 11-13 wks x 1 year Please counsel on need for abstinance/back up condoms next 7 days Pt counseled to do PT 02/13/20 and call if +     No follow-ups on file.  No future appointments.  Alberteen Spindle, CNM

## 2020-04-11 ENCOUNTER — Other Ambulatory Visit: Payer: Self-pay | Admitting: Obstetrics and Gynecology

## 2020-04-20 ENCOUNTER — Other Ambulatory Visit: Payer: Self-pay

## 2020-04-20 ENCOUNTER — Ambulatory Visit (LOCAL_COMMUNITY_HEALTH_CENTER): Payer: Self-pay

## 2020-04-20 VITALS — BP 123/78 | Ht 60.0 in | Wt 142.0 lb

## 2020-04-20 DIAGNOSIS — Z3009 Encounter for other general counseling and advice on contraception: Secondary | ICD-10-CM

## 2020-04-20 DIAGNOSIS — Z30013 Encounter for initial prescription of injectable contraceptive: Secondary | ICD-10-CM

## 2020-04-21 NOTE — Progress Notes (Signed)
Pt seen 04/20/2020 for depo visit. 11 weeks 1 day post depo. Pt reports she did not do preg test on 02/13/20 as E. Sciora, CNM recommended from visit 02/02/2020. Consult with Beatris Si, PA who advises RN to counsel pt and offer preg test if she desires, if she declines preg test today, may proceed with depo today. RN counseled pt and pt refuses preg test today. Depo 150 mg IM given left deltoid per order by E.Sciora, CNM dated 02/02/2020. Tolerated well. Next depo due 07/06/2020, pt aware. Lang. Line as interpreter today. Jerel Shepherd, RN

## 2020-04-21 NOTE — Progress Notes (Signed)
Consulted by RN re: patient here for Depo and did not preform OTC pregnancy test at home as recommended.  RN instructed to counsel patient why pregnancy test is recommended, that she could be pregnant, and unknown risk of continuing Depo to developing fetus and no PNC as well.  Counseled that if patient declines pregnancy test and insists on continuing with Depo despite counseling, may proceed with Depo today.  Reviewed RN note and agree with documentation.

## 2021-03-31 ENCOUNTER — Emergency Department: Payer: Self-pay

## 2021-03-31 ENCOUNTER — Other Ambulatory Visit: Payer: Self-pay

## 2021-03-31 ENCOUNTER — Encounter: Payer: Self-pay | Admitting: Emergency Medicine

## 2021-03-31 ENCOUNTER — Emergency Department
Admission: EM | Admit: 2021-03-31 | Discharge: 2021-03-31 | Disposition: A | Discharge status: Home / Self Care | Payer: Self-pay | Attending: Emergency Medicine | Admitting: Emergency Medicine

## 2021-03-31 DIAGNOSIS — Z20822 Contact with and (suspected) exposure to covid-19: Secondary | ICD-10-CM | POA: Insufficient documentation

## 2021-03-31 DIAGNOSIS — E119 Type 2 diabetes mellitus without complications: Secondary | ICD-10-CM | POA: Insufficient documentation

## 2021-03-31 DIAGNOSIS — R070 Pain in throat: Secondary | ICD-10-CM | POA: Insufficient documentation

## 2021-03-31 LAB — RESP PANEL BY RT-PCR (FLU A&B, COVID) ARPGX2
Influenza A by PCR: NEGATIVE
Influenza B by PCR: NEGATIVE
SARS Coronavirus 2 by RT PCR: NEGATIVE

## 2021-03-31 LAB — CBC
HCT: 40.5 % (ref 36.0–46.0)
Hemoglobin: 13.7 g/dL (ref 12.0–15.0)
MCH: 26.6 pg (ref 26.0–34.0)
MCHC: 33.8 g/dL (ref 30.0–36.0)
MCV: 78.6 fL — ABNORMAL LOW (ref 80.0–100.0)
Platelets: 342 10*3/uL (ref 150–400)
RBC: 5.15 MIL/uL — ABNORMAL HIGH (ref 3.87–5.11)
RDW: 12.7 % (ref 11.5–15.5)
WBC: 12.2 10*3/uL — ABNORMAL HIGH (ref 4.0–10.5)
nRBC: 0 % (ref 0.0–0.2)

## 2021-03-31 LAB — COMPREHENSIVE METABOLIC PANEL
ALT: 39 U/L (ref 0–44)
AST: 35 U/L (ref 15–41)
Albumin: 4.5 g/dL (ref 3.5–5.0)
Alkaline Phosphatase: 60 U/L (ref 38–126)
Anion gap: 5 (ref 5–15)
BUN: 11 mg/dL (ref 6–20)
CO2: 25 mmol/L (ref 22–32)
Calcium: 9.5 mg/dL (ref 8.9–10.3)
Chloride: 107 mmol/L (ref 98–111)
Creatinine, Ser: 0.56 mg/dL (ref 0.44–1.00)
GFR, Estimated: >60 mL/min (ref >60)
Glucose, Bld: 103 mg/dL — ABNORMAL HIGH (ref 70–99)
Potassium: 4.5 mmol/L (ref 3.5–5.1)
Sodium: 137 mmol/L (ref 135–145)
Total Bilirubin: 0.8 mg/dL (ref 0.3–1.2)
Total Protein: 8.1 g/dL (ref 6.5–8.1)

## 2021-03-31 MED ORDER — LIDOCAINE VISCOUS HCL 2 % MT SOLN
15.0000 mL | Freq: Once | OROMUCOSAL | Status: AC
  Administered 2021-03-31: 15 mL via OROMUCOSAL
  Filled 2021-03-31: qty 15

## 2021-03-31 MED ORDER — ONDANSETRON HCL 4 MG/2ML IJ SOLN
4.0000 mg | Freq: Once | INTRAMUSCULAR | Status: AC
  Administered 2021-03-31: 4 mg via INTRAVENOUS
  Filled 2021-03-31: qty 2

## 2021-03-31 MED ORDER — MORPHINE SULFATE (PF) 4 MG/ML IV SOLN
4.0000 mg | INTRAVENOUS | Status: DC | PRN
  Administered 2021-03-31: 4 mg via INTRAVENOUS
  Filled 2021-03-31: qty 1

## 2021-03-31 MED ORDER — SODIUM CHLORIDE 0.9 % IV BOLUS
1000.0000 mL | Freq: Once | INTRAVENOUS | Status: AC
  Administered 2021-03-31: 1000 mL via INTRAVENOUS

## 2021-03-31 NOTE — ED Provider Notes (Signed)
Emergency Medicine Provider Triage Evaluation Note  Judy Clark , a 35 y.o. female  was evaluated in triage.  Pt complains of pain in throat. She and her son were eating fish about an hour ago and a fish bone got stuck in her throat.  Review of Systems  Positive: Throat pain  Negative: Vomiting  Physical Exam  Ht 5' (1.524 m)   Wt 64.4 kg   BMI 27.73 kg/m  Gen:   Awake, no distress   Resp:  Normal effort  MSK:   Moves extremities without difficulty  Other:    Medical Decision Making  Medically screening exam initiated at 4:28 PM.  Appropriate orders placed.  Judy Clark was informed that the remainder of the evaluation will be completed by another provider, this initial triage assessment does not replace that evaluation, and the importance of remaining in the ED until their evaluation is complete.  Swallowing without difficulty. Drinking water.   Chinita Pester, FNP 03/31/21 1635    Georga Hacking, MD 03/31/21 2013

## 2021-03-31 NOTE — Op Note (Signed)
03/31/2021  8:24 PM    Judy Clark  [id number]   Pre-Op Dx: Feeling of a foreign body in the throat from swallowing a fishbone  Post-op Dx: Feeling of a foreign body in the throat from swallowing a fishbone, no foreign body found  Proc: Flexible laryngoscopy  Surg:  [name] Stanislaus Kaltenbach  Anes:  GOT  EBL: None  Comp: None  Findings: No evidence of foreign body anywhere.  The anatomy looked totally normal  Procedure: Patient was seen at the bedside in the ER.  The procedure was explained to the patient and her brother through an interpreter.  The nose was sprayed with 4% Xylocaine mixed with Afrin.  The flexible scope was lubricated and passed through the right nostril as this appeared to be more open.  The nasal mucous membranes were healthy and not congested.  There were no lesions or signs of infection.  Mucous membranes are healthy.  The nasopharynx was clear and healthy.  The hypopharynx showed normal anatomy at the tongue base.  She stuck her tongue out and the vallecula could be completely seen with no sign of a foreign body or any irritation here.  The piriform sinuses could be seen and there is no foreign body in the piriform sinuses.  Her vocal cords moved well to the midline and opened widely and arytenoids look normal.  I did not see any obvious trauma anywhere and I did not see any foreign bodies anywhere and her hypopharynx or laryngeal area.  Scope was removed.  She tolerated the procedure well.  Dispo:   She will be discharged home from the ER  Plan: She will use nonsteroidal anti-inflammatory medications to help control pain along with lozenges that will help lubricate her throat.  The mucous membrane irritation from the fishbone should heal over the next 3 to 7 days and her pain settle down on its own.  She does not need any further follow-up in the office unless she has further problems.  [name] Meta Kroenke  03/31/2021 8:24 PM

## 2021-03-31 NOTE — ED Triage Notes (Signed)
Swallowed a fish bone today at around 1430.  C/O esophageal discomfort at throat.  AAOx3.  Skin warm and dry. NAD.  Voice clear and strong.

## 2021-03-31 NOTE — ED Provider Notes (Signed)
[facility] Emergency Department Provider Note    Event Date/Time   First MD Initiated Contact with Patient 03/31/21 1857     (approximate)  I have reviewed the triage vital signs and the nursing notes.   HISTORY  Chief Complaint Swallowed Foreign Body    HPI Judy Clark is a 35 y.o. female no significant past medical history presents to the ER for evaluation of sensation of something stuck in her throat and pain with swallowing that occurred after she was eating fish did have balloons in the fish.  Felt like she swallowed a piece of fish with bone and felt immediate pain.  She tried eating a banana after to see if it would clear but it caused more pain.  She is tolerating secretions she is able to drink liquids but it is causing discomfort.  No recent fevers.  Past Medical History:  Diagnosis Date   Anemia    Diabetes mellitus without complication (HCC)    Liver disease    "Spot on liver"   Family History  Problem Relation Age of Onset   Hypertension Mother    Diabetes Sister    Diabetes Brother    Past Surgical History:  Procedure Laterality Date   DILATION AND EVACUATION N/A 12/18/2019   Procedure: DILATATION AND EVACUATION;  Surgeon: Natale Milch, MD;  Location: [facility] ORS;  Service: Gynecology;  Laterality: N/A;   Patient Active Problem List   Diagnosis Date Noted   Overweight BMI=27.8 02/02/2020   S/P dilation and curettage 12/18/2019   Incomplete abortion with delayed or excessive hemorrhage    Retained products of conception with hemorrhage    DM type 2 (diabetes mellitus, type 2) (HCC) 10/21/2018   Fatty liver 10/06/2012      Prior to Admission medications   Medication Sig Start Date End Date Taking? Authorizing Provider  ferrous sulfate (FERROUSUL) 325 (65 FE) MG tablet Take 1 tablet (325 mg total) by mouth 2 (two) times daily. 12/19/19   Vena Austria, MD  ibuprofen (ADVIL) 600 MG tablet Take by  mouth. Patient not taking: Reported on 02/02/2020 12/17/19   [provider]  ibuprofen (ADVIL) 600 MG tablet Take 1 tablet (600 mg total) by mouth every 6 (six) hours. Patient not taking: Reported on 02/02/2020 12/19/19   Vena Austria, MD  Multiple Vitamin (MULTIVITAMIN) tablet Take 1 tablet by mouth daily.    [provider]    Allergies Patient has no known allergies.    Social History Social History   Tobacco Use   Smoking status: Never   Smokeless tobacco: Never  Substance Use Topics   Alcohol use: Never   Drug use: Never    Review of Systems Patient denies headaches, rhinorrhea, blurry vision, numbness, shortness of breath, chest pain, edema, cough, abdominal pain, nausea, vomiting, diarrhea, dysuria, fevers, rashes or hallucinations unless otherwise stated above in HPI. ____________________________________________   PHYSICAL EXAM:  VITAL SIGNS: Vitals:   03/31/21 1629  BP: (!) 140/98  Pulse: (!) 114  Resp: (!) 22  Temp: 98 F (36.7 C)  SpO2: 100%    Constitutional: Alert and oriented.  Eyes: Conjunctivae are normal.  Head: Atraumatic. Nose: No congestion/rhinnorhea. Mouth/Throat: Mucous membranes are moist.  Uvula midline, no erythema or trismus Neck: No stridor. Painless ROM.  Cardiovascular: Normal rate, regular rhythm. Grossly normal heart sounds.  Good peripheral circulation. Respiratory: Normal respiratory effort.  No retractions. Lungs CTAB. Gastrointestinal: Soft and nontender. No distention. No abdominal bruits. No CVA  tenderness. Genitourinary:  Musculoskeletal: No lower extremity tenderness nor edema.  No joint effusions. Neurologic:  Normal speech and language. No gross focal neurologic deficits are appreciated. No facial droop Skin:  Skin is warm, dry and intact. No rash noted. Psychiatric: Mood and affect are normal. Speech and behavior are normal.  ____________________________________________   LABS (all labs ordered  are listed, but only abnormal results are displayed)  No results found for this or any previous visit (from the past 24 hour(s)). ____________________________________________ ____________________________________________  RADIOLOGY  I personally reviewed all radiographic images ordered to evaluate for the above acute complaints and reviewed radiology reports and findings.  These findings were personally discussed with the patient.  Please see medical record for radiology report.  ____________________________________________   PROCEDURES  Procedure(s) performed:  Procedures    Critical Care performed: no ____________________________________________   INITIAL IMPRESSION / ASSESSMENT AND PLAN / ED COURSE  Pertinent labs & imaging results that were available during my care of the patient were reviewed by me and considered in my medical decision making (see chart for details).   DDX: esophageal fb, tracheal fb, pharyngitis, angioedema, epiglottitis  Judy Clark is a 35 y.o. who presents to the ED with symptoms as described above.  She is protecting her airway.  Does describe system persistent foreign body in throat sensation.  X-ray ordered at triage does show findings suspicious for retained fishbone.  She is currently protecting her airway I did consult Dr. Elenore Rota of ear nose and throat who has kindly agreed to come evaluate the patient at bedside to see if foregin is able to be visualized with flexible laryngoscopy and determine appropriate course of care after.  Patient signed out to oncoming physician.     The patient was evaluated in Emergency Department today for the symptoms described in the history of present illness. He/she was evaluated in the context of the global COVID-19 pandemic, which necessitated consideration that the patient might be at risk for infection with the SARS-CoV-2 virus that causes COVID-19. Institutional protocols and algorithms that pertain  to the evaluation of patients at risk for COVID-19 are in a state of rapid change based on information released by regulatory bodies including the CDC and federal and state organizations. These policies and algorithms were followed during the patient's care in the ED.  As part of my medical decision making, I reviewed the following data within the electronic MEDICAL RECORD NUMBER Nursing notes reviewed and incorporated, Labs reviewed, notes from prior ED visits and [place] Controlled Substance Database   ____________________________________________   FINAL CLINICAL IMPRESSION(S) / ED DIAGNOSES  Final diagnoses:  Pain in throat      NEW MEDICATIONS STARTED DURING THIS VISIT:  New Prescriptions   No medications on file     Note:  This document was prepared using Dragon voice recognition software and may include unintentional dictation errors.    [name], MD 03/31/21 [phone]

## 2021-03-31 NOTE — ED Provider Notes (Signed)
  35 year old female being evaluated in the emergency department for foreign body sensation in her throat after eating fish earlier today.  See MD note for further information.  Patient received in signout.  Awaiting Dr. Elenore Rota.  Physical Exam  BP (!) 140/105   Pulse 99   Temp 98 F (36.7 C) (Oral)   Resp 16   Ht 5' (1.524 m)   Wt 64.4 kg   SpO2 100%   BMI 27.73 kg/m   Physical Exam  ED Course/Procedures     Procedures  MDM  Dr. Elenore Rota evaluated patient at bedside. No foreign body identified. He discussed home care and plan with patient via video interpreter. No additional follow up needed.       Chinita Pester, FNP 03/31/21 2251    [name], MD 04/02/21 [phone]

## 2021-03-31 NOTE — Consult Note (Signed)
Judy Clark, Judy Clark [id number] 08-Jul-1985 [name], MD  Reason for Consult: To evaluate for foreign body of the throat.  HPI: The patient is a 35 year old Hispanic female who has been healthy but this evening at dinner she was eating some fish.  She had a piece of the fish that was part of the head that she swallowed and had immediate pain in the right side of her throat.  She felt like it was stuck there.  She tried to eat some further food to clear it but it caused more pain.  She is able to swallow her own saliva and manage her own secretions but it hurts every time she swallows.  She has not had any infection or other problems with her throat.  She did not spit up any blood and she has not been short of breath at all.  Allergies: No Known Allergies  ROS: Review of systems normal other than 12 systems except per HPI.  PMH:  Past Medical History:  Diagnosis Date   Anemia    Diabetes mellitus without complication (HCC)    Liver disease    "Spot on liver"    FH:  Family History  Problem Relation Age of Onset   Hypertension Mother    Diabetes Sister    Diabetes Brother     SH:  Social History   Socioeconomic History   Marital status: Married    Spouse name: Not on file   Number of children: 3   Years of education: 9   Highest education level: Not on file  Occupational History   Not on file  Tobacco Use   Smoking status: Never   Smokeless tobacco: Never  Substance and Sexual Activity   Alcohol use: Never   Drug use: Never   Sexual activity: Yes    Birth control/protection: Condom  Other Topics Concern   Not on file  Social History Narrative   Not on file   Social Determinants of Health   Financial Resource Strain: Not on file  Food Insecurity: Not on file  Transportation Needs: Not on file  Physical Activity: Not on file  Stress: Not on file  Social Connections: Not on file  Intimate Partner Violence: Not on file    PSH:  Past Surgical  History:  Procedure Laterality Date   DILATION AND EVACUATION N/A 12/18/2019   Procedure: DILATATION AND EVACUATION;  Surgeon: Natale Milch, MD;  Location: [facility] ORS;  Service: Gynecology;  Laterality: N/A;    Physical  Exam: Her oropharyngeal exam is normal.  Her neck is negative for any nodes or masses or swelling.  She is tender on her right side some and she feels like her right ear is bothering her.  Flexible laryngoscopy is done to visualize the hypopharynx and larynx directly.  This is dictated in detail elsewhere.  I cannot see any evidence of a foreign body anywhere in the throat.  I did look at the CT scan which has a tiny little white line that could be suggestive of a foreign body but it could be an artifact or confluent line.  I looked carefully with the flexible scope and could not find any problems anywhere.   A/P: Patient swallowed a part of a fishbone that has traumatized her throat.  As far as I can tell this has already gone down but did cause some trauma to the mucosa that is leaving her with the sensation of a foreign body every time she swallows.  This will  heal on its own and take several days to a week to settle down where it will not bother her anymore.  In the meantime she can continue to use ibuprofen or naproxen for discomfort.  She will also suck on some hard candies that produce saliva as this will help coat the surface better and soothe it more.  She will stay well-hydrated as well. She does not need a follow-up visit at the office as this should heal on its own and she should not have any long-term sequela.  If she still has pain within a week and it is not settling down then we can consider looking again but it is most likely that this will heal and pain go away on its own.   [name] [name] 03/31/2021 8:28 PM

## 2021-03-31 NOTE — ED Triage Notes (Signed)
See triage note, interpreter used. States swallowed a fish bone an hour ago while eating. Pt swallowing saliva with no difficulty

## 2021-07-07 ENCOUNTER — Emergency Department
Admission: EM | Admit: 2021-07-07 | Discharge: 2021-07-07 | Disposition: A | Discharge status: Home / Self Care | Payer: Self-pay | Attending: Emergency Medicine | Admitting: Emergency Medicine

## 2021-07-07 DIAGNOSIS — Z5321 Procedure and treatment not carried out due to patient leaving prior to being seen by health care provider: Secondary | ICD-10-CM | POA: Insufficient documentation

## 2021-07-07 DIAGNOSIS — R531 Weakness: Secondary | ICD-10-CM | POA: Insufficient documentation

## 2021-07-07 LAB — CBC WITH DIFFERENTIAL/PLATELET
Abs Immature Granulocytes: 0.05 10*3/uL (ref 0.00–0.07)
Basophils Absolute: 0.1 10*3/uL (ref 0.0–0.1)
Basophils Relative: 0 %
Eosinophils Absolute: 0 10*3/uL (ref 0.0–0.5)
Eosinophils Relative: 0 %
HCT: 43.2 % (ref 36.0–46.0)
Hemoglobin: 14.1 g/dL (ref 12.0–15.0)
Immature Granulocytes: 0 %
Lymphocytes Relative: 15 %
Lymphs Abs: 2 10*3/uL (ref 0.7–4.0)
MCH: 26 pg (ref 26.0–34.0)
MCHC: 32.6 g/dL (ref 30.0–36.0)
MCV: 79.7 fL — ABNORMAL LOW (ref 80.0–100.0)
Monocytes Absolute: 0.7 10*3/uL (ref 0.1–1.0)
Monocytes Relative: 5 %
Neutro Abs: 10.6 10*3/uL — ABNORMAL HIGH (ref 1.7–7.7)
Neutrophils Relative %: 80 %
Platelets: 312 10*3/uL (ref 150–400)
RBC: 5.42 MIL/uL — ABNORMAL HIGH (ref 3.87–5.11)
RDW: 12.5 % (ref 11.5–15.5)
WBC: 13.4 10*3/uL — ABNORMAL HIGH (ref 4.0–10.5)
nRBC: 0 % (ref 0.0–0.2)

## 2021-07-07 LAB — COMPREHENSIVE METABOLIC PANEL
ALT: 18 U/L (ref 0–44)
AST: 20 U/L (ref 15–41)
Albumin: 4.6 g/dL (ref 3.5–5.0)
Alkaline Phosphatase: 63 U/L (ref 38–126)
Anion gap: 9 (ref 5–15)
BUN: 9 mg/dL (ref 6–20)
CO2: 23 mmol/L (ref 22–32)
Calcium: 9.8 mg/dL (ref 8.9–10.3)
Chloride: 105 mmol/L (ref 98–111)
Creatinine, Ser: 0.6 mg/dL (ref 0.44–1.00)
GFR, Estimated: >60 mL/min (ref >60)
Glucose, Bld: 123 mg/dL — ABNORMAL HIGH (ref 70–99)
Potassium: 3.5 mmol/L (ref 3.5–5.1)
Sodium: 137 mmol/L (ref 135–145)
Total Bilirubin: 0.5 mg/dL (ref 0.3–1.2)
Total Protein: 8.2 g/dL — ABNORMAL HIGH (ref 6.5–8.1)

## 2021-07-07 NOTE — ED Notes (Signed)
Pt refused labs until she is roomed.

## 2021-07-07 NOTE — ED Triage Notes (Signed)
Video interpreter utilized for triage Christain [place]).  Pt presents via POV with complaints of weakness following dentist appointment (0930 AM) where she had a molar removed. She states she was told at the appointment that she had high BP and it was 170s but was unsure what that meant and denies a hx of HTN. She endorses feeling fatigued and having cold hands and feet since the procedure today. Of note, the patient was not prescribed any pain medication nor any antibiotics. Denies CP or SOB.

## 2021-07-07 NOTE — ED Notes (Signed)
Informed by pt and family that they are leaving, pt reports feeling improved.
